# Patient Record
Sex: Female | Born: 1990 | Race: White | Hispanic: No | Marital: Single | Smoking: Never smoker
Health system: Southern US, Community
[De-identification: ages and names within clinical notes are randomized; demographics above are authoritative.]

## PROBLEM LIST (undated history)

## (undated) DIAGNOSIS — M5430 Sciatica, unspecified side: Secondary | ICD-10-CM

## (undated) HISTORY — PX: ABDOMINAL SURGERY: SHX537

## (undated) HISTORY — PX: CHOLECYSTECTOMY: SHX55

## (undated) MED ORDER — TRAMADOL 50 MG TAB
50 mg | ORAL_TABLET | Freq: Four times a day (QID) | ORAL | Status: DC | PRN
Start: ? — End: 2013-06-27

## (undated) MED ORDER — PROMETHAZINE 25 MG TAB
25 mg | ORAL_TABLET | Freq: Four times a day (QID) | ORAL | Status: DC | PRN
Start: ? — End: 2013-06-03

## (undated) MED ORDER — OXYCODONE-ACETAMINOPHEN 5 MG-325 MG TAB
5-325 mg | ORAL_TABLET | Freq: Four times a day (QID) | ORAL | Status: DC | PRN
Start: ? — End: 2013-09-28

## (undated) MED ORDER — CEPHALEXIN 500 MG CAP
500 mg | ORAL_CAPSULE | Freq: Three times a day (TID) | ORAL | Status: DC
Start: ? — End: 2013-06-03

## (undated) MED ORDER — HYDROCODONE-ACETAMINOPHEN 5 MG-325 MG TAB
5-325 mg | ORAL_TABLET | ORAL | Status: DC | PRN
Start: ? — End: 2013-11-26

## (undated) MED ORDER — ONDANSETRON 4 MG TAB, RAPID DISSOLVE
4 mg | ORAL_TABLET | Freq: Three times a day (TID) | ORAL | Status: DC | PRN
Start: ? — End: 2013-06-03

## (undated) MED ORDER — AZITHROMYCIN 250 MG TAB
250 mg | PACK | ORAL | Status: DC
Start: ? — End: 2013-06-08

## (undated) MED ORDER — ALBUTEROL SULFATE HFA 90 MCG/ACTUATION AEROSOL INHALER
90 mcg/actuation | RESPIRATORY_TRACT | Status: DC | PRN
Start: ? — End: 2013-06-08

## (undated) MED ORDER — ONDANSETRON 4 MG TAB, RAPID DISSOLVE
4 mg | ORAL_TABLET | Freq: Three times a day (TID) | ORAL | Status: DC | PRN
Start: ? — End: 2013-06-27

## (undated) MED ORDER — CODEINE-GUAIFENESIN 10 MG-100 MG/5 ML ORAL LIQUID
100-10 mg/5 mL | Freq: Three times a day (TID) | ORAL | Status: DC | PRN
Start: ? — End: 2013-06-08

## (undated) MED ORDER — CLINDAMYCIN 300 MG CAP
300 mg | ORAL_CAPSULE | Freq: Two times a day (BID) | ORAL | Status: DC
Start: ? — End: 2013-06-08

## (undated) MED ORDER — LEVOFLOXACIN 500 MG TAB
500 mg | ORAL_TABLET | Freq: Every day | ORAL | Status: AC
Start: ? — End: 2013-06-15

---
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
## 2008-01-26 ENCOUNTER — Emergency Department: Payer: Self-pay | Admitting: Emergency Medicine

## 2008-01-29 ENCOUNTER — Ambulatory Visit: Payer: Self-pay | Admitting: General Surgery

## 2008-03-02 ENCOUNTER — Emergency Department: Payer: Self-pay | Admitting: Emergency Medicine

## 2008-05-14 ENCOUNTER — Emergency Department: Payer: Self-pay | Admitting: Unknown Physician Specialty

## 2008-05-22 ENCOUNTER — Emergency Department: Payer: Self-pay | Admitting: Emergency Medicine

## 2008-07-15 ENCOUNTER — Observation Stay: Payer: Self-pay

## 2008-08-01 ENCOUNTER — Observation Stay: Payer: Self-pay

## 2008-08-18 ENCOUNTER — Observation Stay: Payer: Self-pay | Admitting: Obstetrics & Gynecology

## 2008-08-19 ENCOUNTER — Ambulatory Visit: Payer: Self-pay | Admitting: Obstetrics & Gynecology

## 2008-09-15 ENCOUNTER — Observation Stay: Payer: Self-pay

## 2008-09-28 ENCOUNTER — Observation Stay: Payer: Self-pay | Admitting: Unknown Physician Specialty

## 2008-10-08 ENCOUNTER — Inpatient Hospital Stay: Payer: Self-pay | Admitting: Obstetrics and Gynecology

## 2008-12-04 ENCOUNTER — Emergency Department: Payer: Self-pay | Admitting: Emergency Medicine

## 2009-04-29 ENCOUNTER — Emergency Department: Payer: Self-pay | Admitting: Internal Medicine

## 2009-05-02 ENCOUNTER — Ambulatory Visit: Payer: Self-pay | Admitting: Internal Medicine

## 2009-05-13 ENCOUNTER — Emergency Department: Payer: Self-pay | Admitting: Emergency Medicine

## 2010-06-06 ENCOUNTER — Emergency Department: Payer: Self-pay | Admitting: Emergency Medicine

## 2010-07-14 ENCOUNTER — Emergency Department: Payer: Self-pay | Admitting: Emergency Medicine

## 2010-08-17 ENCOUNTER — Emergency Department: Payer: Self-pay | Admitting: Emergency Medicine

## 2010-09-09 ENCOUNTER — Emergency Department: Payer: Self-pay | Admitting: Emergency Medicine

## 2010-09-17 ENCOUNTER — Emergency Department: Payer: Self-pay | Admitting: Emergency Medicine

## 2010-09-29 ENCOUNTER — Emergency Department: Payer: Self-pay | Admitting: Emergency Medicine

## 2010-10-10 ENCOUNTER — Emergency Department: Payer: Self-pay | Admitting: Emergency Medicine

## 2010-12-26 ENCOUNTER — Emergency Department: Payer: Self-pay | Admitting: Psychiatry

## 2011-03-20 ENCOUNTER — Emergency Department: Payer: Self-pay | Admitting: Emergency Medicine

## 2011-05-17 ENCOUNTER — Emergency Department: Payer: Self-pay | Admitting: Emergency Medicine

## 2011-05-20 ENCOUNTER — Emergency Department: Payer: Self-pay | Admitting: Emergency Medicine

## 2011-07-13 ENCOUNTER — Emergency Department: Payer: Self-pay | Admitting: Emergency Medicine

## 2011-08-19 ENCOUNTER — Emergency Department: Payer: Self-pay | Admitting: Unknown Physician Specialty

## 2011-10-13 ENCOUNTER — Emergency Department: Payer: Self-pay | Admitting: Emergency Medicine

## 2011-12-24 ENCOUNTER — Emergency Department: Payer: Self-pay | Admitting: Emergency Medicine

## 2012-02-11 ENCOUNTER — Emergency Department: Payer: Self-pay | Admitting: Emergency Medicine

## 2012-02-11 LAB — URINALYSIS, COMPLETE
Bilirubin,UR: NEGATIVE
Nitrite: NEGATIVE
Ph: 5 (ref 4.5–8.0)
RBC,UR: 4 /HPF (ref 0–5)
Specific Gravity: 1.026 (ref 1.003–1.030)
Squamous Epithelial: 3

## 2012-02-25 ENCOUNTER — Other Ambulatory Visit (HOSPITAL_COMMUNITY): Payer: Self-pay | Admitting: Neurosurgery

## 2012-02-25 DIAGNOSIS — M545 Low back pain, unspecified: Secondary | ICD-10-CM

## 2012-02-25 DIAGNOSIS — M5126 Other intervertebral disc displacement, lumbar region: Secondary | ICD-10-CM

## 2012-02-28 ENCOUNTER — Other Ambulatory Visit: Payer: Self-pay | Admitting: Radiology

## 2012-02-29 ENCOUNTER — Encounter (HOSPITAL_COMMUNITY): Payer: Self-pay | Admitting: Pharmacy Technician

## 2012-03-03 ENCOUNTER — Encounter (HOSPITAL_COMMUNITY): Payer: Self-pay

## 2012-03-03 ENCOUNTER — Ambulatory Visit (HOSPITAL_COMMUNITY)
Admission: RE | Admit: 2012-03-03 | Discharge: 2012-03-03 | Disposition: A | Discharge status: Home / Self Care | Payer: Self-pay | Source: Ambulatory Visit | Attending: Neurosurgery | Admitting: Neurosurgery

## 2012-03-03 DIAGNOSIS — M79609 Pain in unspecified limb: Secondary | ICD-10-CM | POA: Insufficient documentation

## 2012-03-03 DIAGNOSIS — M545 Low back pain, unspecified: Secondary | ICD-10-CM

## 2012-03-03 DIAGNOSIS — M5126 Other intervertebral disc displacement, lumbar region: Secondary | ICD-10-CM

## 2012-03-03 MED ORDER — ONDANSETRON HCL 4 MG/2ML IJ SOLN: 4.0000 mg | Freq: Four times a day (QID) | INTRAMUSCULAR | Status: DC | PRN

## 2012-03-03 MED ORDER — IOHEXOL 180 MG/ML  SOLN
20.0000 mL | Freq: Once | INTRAMUSCULAR | Status: AC | PRN
  Administered 2012-03-03: 13 mL via INTRATHECAL

## 2012-03-03 MED ORDER — OXYCODONE-ACETAMINOPHEN 5-325 MG PO TABS
2.0000 | ORAL_TABLET | Freq: Once | ORAL | Status: AC
Start: 2012-03-03 — End: 2012-03-03
  Administered 2012-03-03: 2 via ORAL

## 2012-03-03 MED ORDER — DIAZEPAM 5 MG PO TABS
10.0000 mg | ORAL_TABLET | Freq: Once | ORAL | Status: AC
  Administered 2012-03-03: 10 mg via ORAL

## 2012-03-03 MED ORDER — OXYCODONE-ACETAMINOPHEN 5-325 MG PO TABS
ORAL_TABLET | ORAL | Status: AC
  Filled 2012-03-03: qty 2

## 2012-03-03 MED ORDER — DIAZEPAM 5 MG PO TABS
ORAL_TABLET | ORAL | Status: AC
  Administered 2012-03-03: 10 mg via ORAL
  Filled 2012-03-03: qty 2

## 2012-03-03 NOTE — Procedures (Signed)
15 ml Omni 180 @ L2/3 via a right paramidline approach with a 22g needle.  No complication.  See radiology tab for full report.

## 2012-03-03 NOTE — Discharge Instructions (Signed)
HOLD ULTRAM & NUCYNTA FOR 24 HOURS.  MAY RESUME ALL OTHER HOME MEDS.     Myelography Care After These instructions give you information on caring for yourself after your procedure. Your doctor may also give you specific instructions. Call your doctor if you have any problems or questions after your procedure. HOME CARE  Lie down for 24 hours. Lie in any position with 1 pillow under your head.   For 24 hours, get up only to eat or use the bathroom. Take only 10 minutes to eat.   For 24 hours, drink enough fluids to keep your pee (urine) clear or pale yellow. No alcohol.   Take all medicine as told by your doctor.   Avoid heavy lifting and activity for 48 hours.   You may take the bandage off the day after your myelography.   Do not take a bath for 24 hours. Ask your doctor if it is okay to take a shower.  Finding out the results of your test Ask your doctor when your test results will be ready. Make sure you follow up and get the test results. GET HELP RIGHT AWAY IF:   Any of the places where the needles were put in:   Are puffy (swollen) or red.   Are sore or hot to the touch.   Are draining yellowish-white fluid (pus).   Are bleeding after 10 minutes of pressing down on the site. Have someone press on any place that is bleeding until seen by a doctor.   You have a lasting headache that is not helped by medicine.   You have a bad headache with a stiff neck or fever.   You have trouble breathing.   You feel sick to your stomach (nauseous) or throw up (vomit).   You have pain or cramping in your belly (abdomen).   You have a fever.  If you go to the emergency room, tell the doctor you had a myelogram. Take this paper with you to show the doctor. MAKE SURE YOU:  Understand these instructions.   Will watch your condition.   Will get help right away if you are not doing well or get worse.  Document Released: 05/29/2008 Document Revised: 08/09/2011 Document  Reviewed: 05/29/2008 [facility] Patient Information 2012 [place], [place].

## 2012-04-16 ENCOUNTER — Emergency Department: Payer: Self-pay | Admitting: Emergency Medicine

## 2012-04-16 LAB — URINALYSIS, COMPLETE
Blood: NEGATIVE
Nitrite: POSITIVE
Ph: 5 (ref 4.5–8.0)
Protein: NEGATIVE
Specific Gravity: 1.023 (ref 1.003–1.030)
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
## 2013-05-05 NOTE — ED Provider Notes (Signed)
HPI Comments: Toni Martinez is a 22 y.o. female who presents ambulatory to the ED with c/o nasal congestion and sore throat for 4 days.  She has been using OTC meds.  No fever or chills, some cough.  + sick contacts.  Throat pain is 8/10.  LKMP now.     PCP: None  PMHx significant for: denies  PSHx significant for: denies  Social Hx:  smoking (denies)                     alcohol (denies)                       There are no other complaints, changes, or physical findings at this time.  Charlie Pitter, MD           History reviewed. No pertinent past medical history.     Past Surgical History   Procedure Laterality Date   ??? Hx cholecystectomy     ??? Hx cesarean section           History reviewed. No pertinent family history.     History     Social History   ??? Marital Status: SINGLE     Spouse Name: N/A     Number of Children: N/A   ??? Years of Education: N/A     Occupational History   ??? Not on file.     Social History Main Topics   ??? Smoking status: Never Smoker    ??? Smokeless tobacco: Not on file   ??? Alcohol Use: No   ??? Drug Use: Not on file   ??? Sexually Active: Not on file     Other Topics Concern   ??? Not on file     Social History Narrative   ??? No narrative on file                  ALLERGIES: Pcn      Review of Systems   Constitutional: Negative for fever and chills.   HENT: Positive for congestion, sore throat, rhinorrhea, sneezing and postnasal drip. Negative for trouble swallowing, neck pain and neck stiffness.    Respiratory: Positive for cough.    Cardiovascular: Negative for chest pain.   Gastrointestinal: Negative for nausea, vomiting and abdominal pain.   Genitourinary: Negative for dysuria.   Neurological: Negative for weakness.   Hematological: Positive for adenopathy.       Filed Vitals:    05/05/13 0727   BP: 117/82   Pulse: 88   Temp: 98.9 ??F (37.2 ??C)   Resp: 16   Height: 5\' 5"  (1.651 m)   Weight: 92.9 kg (204 lb 12.9 oz)   SpO2: 98%            Physical Exam   Nursing note and vitals reviewed.   Constitutional: She is oriented to person, place, and time. She appears well-developed and well-nourished. No distress.   HENT:   Head: Normocephalic and atraumatic.   Right Ear: External ear normal.   Left Ear: External ear normal.   Moist mucous membranes with minimal pharyngeal erythema   Eyes: EOM are normal. Pupils are equal, round, and reactive to light.   Neck: Normal range of motion. Neck supple. No tracheal deviation present.   Shoddy anterior cervical adenopathy   Cardiovascular: Normal rate, regular rhythm, normal heart sounds and intact distal pulses.    No murmur heard.  Pulmonary/Chest: Effort normal. No stridor. No respiratory distress. She  has wheezes.   Musculoskeletal: Normal range of motion. She exhibits no edema.   Lymphadenopathy:     She has cervical adenopathy.   Neurological: She is alert and oriented to person, place, and time.   Skin: Skin is warm and dry.        MDM     Differential Diagnosis; Clinical Impression; Plan:     Viral URI vs allergic rhinitis - will treat symptomatically.       Procedures    1. Acute upper respiratory infection      7:47 AM  Jodie Echevaria  results have been reviewed with her.  She has been counseled regarding her diagnosis.  She verbally conveys understanding and agreement of the signs, symptoms, diagnosis, treatment and prognosis.  PCP list was given.  She also agrees with the care-plan and conveys that all of her questions have been answered.  I have also put together some discharge instructions for her that include: 1) educational information regarding their diagnosis, 2) how to care for their diagnosis at home, as well a 3) list of reasons why they would want to return to the ED prior to their follow-up appointment, should their condition change.

## 2013-05-05 NOTE — ED Notes (Signed)
Assumed care of patient. Pt resting in position of comfort. Call bell within reach. Pt here for productive cough with green sputum, sore throat, nasal congestion and right ear pain for several days. Works in [employer] and states + sick contacts. Has been taking tylenol and motrin for relief.

## 2013-05-05 NOTE — ED Notes (Signed)
Discharge instructions reviewed with pt by Dr. Eulah Pont. pt able to return/verbalize discharge instructions. Copy of discharge instructions given. Patient condition stable, respiratory status within normal limits, neuro status intact. ambulatory out of er.

## 2013-05-27 NOTE — ED Provider Notes (Signed)
HPI Comments: Toni Martinez is a 22 y.o. female who presents ambulatory to ED with cc of productive cough x 1 month. Pt reports productive cough with mucus x 1 month, noting she was seen at [facility] ED 2 weeks ago for similar sxs and was dx'd with URI. Pt notes cough is worse with deep inspiration, as well as associated post-tussive emesis, wheezing, dizziness, lightheadedness, and chest tightness. Pt also notes hx of UTI and reports BL flank pain, nausea, suprapubic pain, dysuria, and fever of 101F. Pt notes taking tylenol around 1800 tonight.      PCP: None   PMHx significant for: pt denies  PSHx significant for: Cholecystectomy, c-section  Social Hx: -tobacco, -EtOH, -illicit drug use    There are no other complaints, changes or physical findings at this time.   Written by Adele Dan, ED Scribe, as dictated by Abbigayle Toole. Laurier Nancy, MD.      The history is provided by the patient.        No past medical history on file.     Past Surgical History   Procedure Laterality Date   ??? Hx cholecystectomy     ??? Hx cesarean section           No family history on file.     History     Social History   ??? Marital Status: SINGLE     Spouse Name: N/A     Number of Children: N/A   ??? Years of Education: N/A     Occupational History   ??? Not on file.     Social History Main Topics   ??? Smoking status: Never Smoker    ??? Smokeless tobacco: Not on file   ??? Alcohol Use: No   ??? Drug Use: Not on file   ??? Sexually Active: Not on file     Other Topics Concern   ??? Not on file     Social History Narrative   ??? No narrative on file                  ALLERGIES: Pcn      Review of Systems   Constitutional: Positive for fever. Negative for chills.   HENT: Negative.  Negative for congestion, sore throat, rhinorrhea, sneezing and neck stiffness.    Eyes: Negative.  Negative for discharge, redness and visual disturbance.   Respiratory: Positive for cough (productive), chest tightness and wheezing. Negative for shortness of breath.    Cardiovascular: Negative.   Negative for chest pain, palpitations and leg swelling.   Gastrointestinal: Positive for nausea, vomiting (post-tussive) and abdominal pain (suprapubic).   Endocrine: Negative.  Negative for polydipsia and polyuria.   Genitourinary: Positive for dysuria and flank pain (BL). Negative for frequency and difficulty urinating.   Musculoskeletal: Negative.  Negative for myalgias, back pain and arthralgias.   Skin: Negative.  Negative for rash and wound.   Allergic/Immunologic: Negative.    Neurological: Positive for dizziness and light-headedness. Negative for syncope, weakness and headaches.   Hematological: Negative.  Negative for adenopathy. Does not bruise/bleed easily.   Psychiatric/Behavioral: Negative.  Negative for confusion. The patient is not nervous/anxious.    All other systems reviewed and are negative.        Filed Vitals:    05/27/13 2023 05/27/13 2355   BP: 182/102 115/79   Pulse: 130 88   Temp: 98.3 ??F (36.8 ??C)    Resp: 20 18   Height: 5\' 4"  (1.626 Chavy [employer]vera)  Weight: 93 kg (205 lb 0.4 oz)    SpO2: 99% 98%            Physical Exam   Nursing note and vitals reviewed.  Constitutional: She is oriented to person, place, and time. She appears well-developed and well-nourished. She appears distressed.   Vomiting with cough   HENT:   Head: Normocephalic and atraumatic.   Eyes: Conjunctivae and EOM are normal. Pupils are equal, round, and reactive to light.   Neck: Normal range of motion. Neck supple. No JVD present.   Cardiovascular: Normal rate, regular rhythm, normal heart sounds and intact distal pulses.    No murmur heard.  Pulmonary/Chest: Breath sounds normal. No respiratory distress. She has no wheezes. She has no rales. She exhibits no tenderness.   Prolonged exp phase   Abdominal: Soft. Bowel sounds are normal. She exhibits no distension and no mass. There is no tenderness. There is no rebound and no guarding.   Musculoskeletal: Normal range of motion. She exhibits no edema and no tenderness.   Neurological:  She is alert and oriented to person, place, and time. She has normal strength. No cranial nerve deficit or sensory deficit. She exhibits normal muscle tone.   Skin: Skin is warm and dry. No rash noted.   Psychiatric: She has a normal mood and affect. Her behavior is normal.        MDM     Differential Diagnosis; Clinical Impression; Plan:     DDx: Bronchitis, Pneumonia, UTI, MSK pain    UTI/pregnant, cough with recent URI, better with meds, will refer to PCP for f/u  Amount and/or Complexity of Data Reviewed:   Clinical lab tests:  Ordered and reviewed  Tests in the radiology section of CPT??:  Ordered and reviewed   Obtain history from someone other than the patient:  No   Review and summarize past medical records:  Yes   Discuss the patient with another provider:  No   Independant visualization of image, tracing, or specimen:  Yes  Progress:   Patient progress:  Stable and improved      Procedures    11:08 PM  Pt has been re-evaluated and results discussed. Pt notified of pregnancy, will order lab work and medicate.   Written by Adele Dan, ED Scribe, as dictated by Dulac Dowis. Laurier Nancy, MD.     12:21 AM    I have reviewed all pertinent and currently available diagnostic test results for this visit including, but not limited to, labs, xrays, and EKGs. I have reviewed all pertinent and currently available medical records. My plan of care and further evaluation and/or disposition is based on these results, as well as the initial, and subsequent, history and physical exam, as well as any additional complaints during the visit.    Toney Rakes, MD     12:34 AM  Pt has been re-evaluated and states she is hungry. Able to tolerate crackers and ginger ale PO.   Written by Adele Dan, ED Scribe, as dictated by Siboney Requejo. Laurier Nancy, MD.     LABORATORY TESTS:  Recent Results (from the past 12 hour(s))   HCG URINE, QL    Collection Time     05/27/13  9:46 PM       Result Value Range    HCG urine, Ql. POSITIVE (*) NEG      URINALYSIS W/ REFLEX CULTURE    Collection Time     05/27/13  9:46 PM  Result Value Range    Color YELLOW/STRAW      Appearance TURBID (*) CLEAR      Specific gravity 1.030  1.003 - 1.030      pH (UA) 5.5  5.0 - 8.0      Protein 30 (*) NEG mg/dL    Glucose NEGATIVE   NEG mg/dL    Ketone NEGATIVE   NEG mg/dL    Bilirubin SMALL (*) NEG      Blood NEGATIVE   NEG      Urobilinogen 1.0  0.2 - 1.0 EU/dL    Nitrites NEGATIVE   NEG      Leukocyte Esterase MODERATE (*) NEG      WBC 10-20  0 - 4 /hpf    RBC 20-50  0 - 5 /hpf    Epithelial cells MANY (*) FEW /lpf    Bacteria 1+ (*) NEG /hpf    UA:UC IF INDICATED URINE CULTURE ORDERED (*) CNI      CA Oxalate Crystals 1+ (*) NEG   CBC WITH AUTOMATED DIFF    Collection Time     05/27/13 10:56 PM       Result Value Range    WBC 9.4  3.6 - 11.0 K/uL    RBC 4.52  3.80 - 5.20 Orrie Lascano/uL    HGB 11.4 (*) 11.5 - 16.0 g/dL    HCT 16.1  09.6 - 04.5 %    MCV 78.8 (*) 80.0 - 99.0 FL    MCH 25.2 (*) 26.0 - 34.0 PG    MCHC 32.0  30.0 - 36.5 g/dL    RDW 40.9 (*) 81.1 - 14.5 %    PLATELET 226  150 - 400 K/uL    NEUTROPHILS 61  32 - 75 %    LYMPHOCYTES 32  12 - 49 %    MONOCYTES 5  5 - 13 %    EOSINOPHILS 2  0 - 7 %    BASOPHILS 0  0 - 1 %    ABS. NEUTROPHILS 5.7  1.8 - 8.0 K/UL    ABS. LYMPHOCYTES 3.0  0.8 - 3.5 K/UL    ABS. MONOCYTES 0.5  0.0 - 1.0 K/UL    ABS. EOSINOPHILS 0.1  0.0 - 0.4 K/UL    ABS. BASOPHILS 0.0  0.0 - 0.1 K/UL   METABOLIC PANEL, COMPREHENSIVE    Collection Time     05/27/13 10:56 PM       Result Value Range    Sodium 140  136 - 145 mmol/L    Potassium 3.2 (*) 3.5 - 5.1 mmol/L    Chloride 106  97 - 108 mmol/L    CO2 24  21 - 32 mmol/L    Anion gap 10  5 - 15 mmol/L    Glucose 109 (*) 65 - 100 mg/dL    BUN 14  6 - 20 MG/DL    Creatinine 9.14  7.82 - 1.15 MG/DL    BUN/Creatinine ratio 17  12 - 20      GFR est AA >60  >60 ml/min/1.40m2    GFR est non-AA >60  >60 ml/min/1.41m2    Calcium 9.2  8.5 - 10.1 MG/DL    Bilirubin, total 0.3  0.2 - 1.0 MG/DL    ALT 17  12 - 78 U/L    AST  13 (*) 15 - 37 U/L    Alk. phosphatase 68  45 - 117 U/L    Protein, total 7.7  6.4 -  8.2 g/dL    Albumin 4.1  3.5 - 5.0 g/dL    Globulin 3.6  2.0 - 4.0 g/dL    A-G Ratio 1.1  1.1 - 2.2     LIPASE    Collection Time     05/27/13 10:56 PM       Result Value Range    Lipase 123  73 - 393 U/L       IMAGING RESULTS:     XR CHEST PA LAT (Final result)  Result time: 05/27/13 21:07:11      Final result by Rad Results In Edi (05/27/13 21:07:11)      Narrative:    **Final Report**      ICD Codes / Adm.Diagnosis: 28 12 / Cough Back Pain  Examination: CR CHEST PA AND LATERAL - [id number] - May 27 2013 8:55PM  Accession No: [id number]  Reason: Chest Pain      REPORT:  INDICATION: Chest Pain.    EXAM: 2 VIEW CHEST RADIOGRAPH.    COMPARISON: None.    FINDINGS: Frontal and lateral views of the chest show clear lungs. . The   heart, mediastinum and pulmonary vasculature are stable. The bony thorax is   unremarkable for age. ..      IMPRESSION:  No acute cardiopulmonary disease radiographically.. .          Signing/Reading Doctor: Creed Copper [phone])   Approved: Creed Copper [phone]) May 27 2013 9:05PM            MEDICATIONS GIVEN:  Medications   sodium chloride (NS) flush 5-10 mL (not administered)   sodium chloride (NS) flush 5-10 mL (not administered)   diphenhydrAMINE (BENADRYL) 50 mg/mL injection (not administered)   albuterol-ipratropium (DUO-NEB) 2.5 MG-0.5 MG/3 ML (3 mL Nebulization Given 05/27/13 2111)   ondansetron (ZOFRAN ODT) tablet 4 mg (4 mg Oral Given 05/27/13 2111)   hydrocodone-acetaminophen (HYCET) 0.5-21.7 mg/mL oral solution 5 mg (5 mg Oral Given 05/27/13 2111)   sodium chloride 0.9 % bolus infusion 1,000 mL (1,000 mL IntraVENous New Bag 05/27/13 2346)   ondansetron (ZOFRAN) injection 4 mg (4 mg IntraVENous Given 05/27/13 2346)   diphenhydrAMINE (BENADRYL) injection 25 mg (25 mg IntraVENous Given 05/27/13 2353)       IMPRESSION:  1. Bronchitis    2. Vomiting of pregnancy    3. Pregnancy, incidental    4. UTI  (urinary tract infection)        PLAN:  1. Phenergan, Zofran, Keflex, Albuterol inhaler, Robitussin  2. F/U with OB/GYN  Return to ED if worse     12:22 AM   Pt has been re-examined and is ready to be discharged. The diagnostic results have been reviewed and discussed with the pt. Care plan has been outlined and pt understands all sx, dx, tx, and rx. There are no new complaints, changes, or physical findings at this time. All questions have been addressed. All medications have been reviewed with the pt; pt will d/c home with Phenergan, Zofran, Keflex, Albuterol inhaler, and Robitussin. Pt has been instructed to and agrees to f/u with OB/GYN as well as return to the ED upon further deterioration. Toni Martinez is ready for discharge.   Written by Adele Dan, ED Scribe, as dictated by Jef Futch. Laurier Nancy, MD.

## 2013-05-27 NOTE — ED Notes (Signed)
Patient c/o itching and burning to IV site after zofran administration.  MD aware and verbal orders for 25 mg of benadryl IV received.

## 2013-05-27 NOTE — ED Notes (Signed)
Patient arrives to ED with complaints of cough x approx. 1 month, diagnosed with URI on 9/2, reports only improvement from symptoms was congestion, no improvement in cough. Patient also reports bilateral lower back pain, pelvic pain, and urinary pain x a few days. Patient denies blood in urine. Patient reports some nausea and cough-induced vomiting. Patient is alert and oriented x 4.

## 2013-05-28 LAB — METABOLIC PANEL, COMPREHENSIVE
A-G Ratio: 1.1 (ref 1.1–2.2)
ALT (SGPT): 17 U/L (ref 12–78)
AST (SGOT): 13 U/L — ABNORMAL LOW (ref 15–37)
Albumin: 4.1 g/dL (ref 3.5–5.0)
Alk. phosphatase: 68 U/L (ref 45–117)
Anion gap: 10 mmol/L (ref 5–15)
BUN/Creatinine ratio: 17 (ref 12–20)
BUN: 14 MG/DL (ref 6–20)
Bilirubin, total: 0.3 MG/DL (ref 0.2–1.0)
CO2: 24 mmol/L (ref 21–32)
Calcium: 9.2 MG/DL (ref 8.5–10.1)
Chloride: 106 mmol/L (ref 97–108)
Creatinine: 0.81 MG/DL (ref 0.45–1.15)
GFR est AA: >60 mL/min/{1.73_m2} (ref >60)
GFR est non-AA: >60 mL/min/{1.73_m2} (ref >60)
Globulin: 3.6 g/dL (ref 2.0–4.0)
Glucose: 109 mg/dL — ABNORMAL HIGH (ref 65–100)
Potassium: 3.2 mmol/L — ABNORMAL LOW (ref 3.5–5.1)
Protein, total: 7.7 g/dL (ref 6.4–8.2)
Sodium: 140 mmol/L (ref 136–145)

## 2013-05-28 LAB — CBC WITH AUTOMATED DIFF
ABS. BASOPHILS: 0 10*3/uL (ref 0.0–0.1)
ABS. EOSINOPHILS: 0.1 10*3/uL (ref 0.0–0.4)
ABS. LYMPHOCYTES: 3 10*3/uL (ref 0.8–3.5)
ABS. MONOCYTES: 0.5 10*3/uL (ref 0.0–1.0)
ABS. NEUTROPHILS: 5.7 10*3/uL (ref 1.8–8.0)
BASOPHILS: 0 % (ref 0–1)
EOSINOPHILS: 2 % (ref 0–7)
HCT: 35.6 % (ref 35.0–47.0)
HGB: 11.4 g/dL — ABNORMAL LOW (ref 11.5–16.0)
LYMPHOCYTES: 32 % (ref 12–49)
MCH: 25.2 PG — ABNORMAL LOW (ref 26.0–34.0)
MCHC: 32 g/dL (ref 30.0–36.5)
MCV: 78.8 FL — ABNORMAL LOW (ref 80.0–99.0)
MONOCYTES: 5 % (ref 5–13)
NEUTROPHILS: 61 % (ref 32–75)
PLATELET: 226 10*3/uL (ref 150–400)
RBC: 4.52 M/uL (ref 3.80–5.20)
RDW: 15.2 % — ABNORMAL HIGH (ref 11.5–14.5)
WBC: 9.4 10*3/uL (ref 3.6–11.0)

## 2013-05-28 LAB — URINALYSIS W/ REFLEX CULTURE
Blood: NEGATIVE
Glucose: NEGATIVE mg/dL
Ketone: NEGATIVE mg/dL
Nitrites: NEGATIVE
Protein: 30 mg/dL — AB
Specific gravity: 1.03 (ref 1.003–1.030)
Urobilinogen: 1 EU/dL (ref 0.2–1.0)
pH (UA): 5.5 (ref 5.0–8.0)

## 2013-05-28 LAB — HCG URINE, QL: HCG urine, QL: POSITIVE — AB

## 2013-05-28 LAB — LIPASE: Lipase: 123 U/L (ref 73–393)

## 2013-05-28 MED ORDER — HYDROCODONE-ACETAMINOPHEN 7.5 MG-325 MG/15 ML ORAL SOLN
ORAL | Status: AC
Start: 2013-05-28 — End: 2013-05-27
  Administered 2013-05-28: 01:00:00 via ORAL

## 2013-05-28 MED ADMIN — diphenhydrAMINE (BENADRYL) injection 25 mg: INTRAVENOUS | @ 04:00:00 | NDC 63323066401

## 2013-05-28 MED ADMIN — sodium chloride 0.9 % bolus infusion 1,000 mL: INTRAVENOUS | @ 04:00:00 | NDC 00409798309

## 2013-05-28 MED ADMIN — albuterol-ipratropium (DUO-NEB) 2.5 MG-0.5 MG/3 ML: RESPIRATORY_TRACT | @ 01:00:00 | NDC 00487020101

## 2013-05-28 MED ADMIN — ondansetron (ZOFRAN ODT) tablet 4 mg: ORAL | @ 01:00:00 | NDC 68462015740

## 2013-05-28 MED ADMIN — ondansetron (ZOFRAN) injection 4 mg: INTRAVENOUS | @ 04:00:00 | NDC 00409475503

## 2013-05-28 NOTE — ED Notes (Signed)
Dr. Garry Heater reviewed discharge instructions with the patient.  The patient verbalized understanding.  Patient ambulatory out of ED with discharge paperwork in hand.

## 2013-05-28 NOTE — ED Notes (Signed)
Patient given crackers and a ginger ale.  Patient tolerated PO challenge well.

## 2013-05-29 NOTE — Progress Notes (Signed)
Quick Note:    D/c home on keflex. Final results and sensitivities pending.  ______

## 2013-05-30 LAB — CULTURE, URINE
Colonies Counted: >100000
Colony Count: >100000

## 2013-05-30 NOTE — Progress Notes (Signed)
Quick Note:    I called a script in for Macrobid to [facility] [phone]).  ______

## 2013-05-30 NOTE — Progress Notes (Signed)
Quick Note:    I left a message.  ______

## 2013-06-03 LAB — BETA HCG, QT
Beta HCG, QT: 55 m[IU]/mL — ABNORMAL HIGH (ref 0–6)
hCG Quant: 55 m[IU]/mL — ABNORMAL HIGH (ref 0–6)

## 2013-06-03 LAB — URINALYSIS W/ REFLEX CULTURE
Bacteria: NEGATIVE /hpf
Bilirubin: NEGATIVE
Blood: NEGATIVE
Glucose: NEGATIVE mg/dL
Ketone: NEGATIVE mg/dL
Leukocyte Esterase: NEGATIVE
Nitrites: NEGATIVE
Specific gravity: 1.026 (ref 1.003–1.030)
Urobilinogen: 1 EU/dL (ref 0.2–1.0)
pH (UA): 6 (ref 5.0–8.0)

## 2013-06-03 LAB — CBC WITH AUTOMATED DIFF
ABS. BASOPHILS: 0 10*3/uL (ref 0.0–0.1)
ABS. EOSINOPHILS: 0.1 10*3/uL (ref 0.0–0.4)
ABS. LYMPHOCYTES: 1.9 10*3/uL (ref 0.8–3.5)
ABS. MONOCYTES: 0.5 10*3/uL (ref 0.0–1.0)
ABS. NEUTROPHILS: 5.3 10*3/uL (ref 1.8–8.0)
BASOPHILS: 0 % (ref 0–1)
EOSINOPHILS: 1 % (ref 0–7)
HCT: 34.5 % — ABNORMAL LOW (ref 35.0–47.0)
HGB: 11.1 g/dL — ABNORMAL LOW (ref 11.5–16.0)
LYMPHOCYTES: 25 % (ref 12–49)
MCH: 25.3 PG — ABNORMAL LOW (ref 26.0–34.0)
MCHC: 32.2 g/dL (ref 30.0–36.5)
MCV: 78.8 FL — ABNORMAL LOW (ref 80.0–99.0)
MONOCYTES: 6 % (ref 5–13)
NEUTROPHILS: 68 % (ref 32–75)
PLATELET: 239 10*3/uL (ref 150–400)
RBC: 4.38 M/uL (ref 3.80–5.20)
RDW: 15.2 % — ABNORMAL HIGH (ref 11.5–14.5)
WBC: 7.7 10*3/uL (ref 3.6–11.0)

## 2013-06-03 LAB — METABOLIC PANEL, COMPREHENSIVE
A-G Ratio: 1 — ABNORMAL LOW (ref 1.1–2.2)
ALT (SGPT): 23 U/L (ref 12–78)
AST (SGOT): 11 U/L — ABNORMAL LOW (ref 15–37)
Albumin: 3.8 g/dL (ref 3.5–5.0)
Alk. phosphatase: 72 U/L (ref 45–117)
Anion gap: 8 mmol/L (ref 5–15)
BUN/Creatinine ratio: 16 (ref 12–20)
BUN: 10 MG/DL (ref 6–20)
Bilirubin, total: 0.9 MG/DL (ref 0.2–1.0)
CO2: 24 mmol/L (ref 21–32)
Calcium: 9.2 MG/DL (ref 8.5–10.1)
Chloride: 107 mmol/L (ref 97–108)
Creatinine: 0.62 MG/DL (ref 0.45–1.15)
GFR est AA: >60 mL/min/{1.73_m2} (ref >60)
GFR est non-AA: >60 mL/min/{1.73_m2} (ref >60)
Globulin: 3.7 g/dL (ref 2.0–4.0)
Glucose: 102 mg/dL — ABNORMAL HIGH (ref 65–100)
Potassium: 3.7 mmol/L (ref 3.5–5.1)
Protein, total: 7.5 g/dL (ref 6.4–8.2)
Sodium: 139 mmol/L (ref 136–145)

## 2013-06-03 LAB — WET PREP: Wet prep: NONE SEEN

## 2013-06-03 LAB — HCG URINE, QL. - POC: Pregnancy test,urine (POC): POSITIVE — AB

## 2013-06-03 LAB — BORDETELLA PERTUSSIS - PCR: Bordetella pertussis - PCR: NOT DETECTED

## 2013-06-03 MED ORDER — IPRATROPIUM-ALBUTEROL 2.5 MG-0.5 MG/3 ML NEB SOLUTION
2.5-0.53 mg-0.5 mg/3 ml | RESPIRATORY_TRACT | Status: AC
Start: 2013-06-03 — End: 2013-06-03
  Administered 2013-06-03: 11:00:00 via RESPIRATORY_TRACT

## 2013-06-03 NOTE — ED Provider Notes (Signed)
HPI Comments: 22 y.o. female with PMH significant for bronchitis presents to the ED from home via private vehicle with cc of cough. Pt reports a constant cough  for 1 month. Pt reports accompanying vomiting (post tussive 1 time per day) with difficulty keeping intake down and states she has had lower abdominal pain for 1 week. Pt complains of chest pain associated with coughing currently rated at 6/10 but resolves after coughing immediately. Pt reports a fluctuating temperature for 1 month with a measured high of 101.9 F occuring last night. Pt reports Tylenol use with control of temperature. Pt states she was evaluated at [facility] last week for symptoms and prescribed cough syrup with codeine, nausea medication, and an inhaler. Pt states the inhaler is not offering relief. Pt reports previous inhaler use with hx of bronchitis which she states she frequently has with symptoms identical to current symptoms.     Pt states she is currently pregnant with  LMP occuring 05/2013. Pt is unsure how far along she is and reports 2 previous pregnancies with hx of preeclampsia. Pt denies pleuritic pain, leg pain, leg swelling, hx of tobacco use, steroid use, dysuria, vaginal bleeding, vaginal discharge.     There are no other complaints at this time.    Surgical Hx is significant for: Cholecystectomy, C-section.     PCP: None  Social hx: reports that she has never smoked. She does not have any smokeless tobacco history on file. She reports that she does not drink alcohol.        The history is provided by the patient.        History reviewed. No pertinent past medical history.     Past Surgical History   Procedure Laterality Date   ??? Hx cholecystectomy     ??? Hx cesarean section           History reviewed. No pertinent family history.     History     Social History   ??? Marital Status: SINGLE     Spouse Name: N/A     Number of Children: N/A   ??? Years of Education: N/A     Occupational History   ??? Not on file.     Social History Main  Topics   ??? Smoking status: Never Smoker    ??? Smokeless tobacco: Not on file   ??? Alcohol Use: No   ??? Drug Use: Not on file   ??? Sexually Active: Not on file     Other Topics Concern   ??? Not on file     Social History Narrative   ??? No narrative on file                  ALLERGIES: Pcn      Review of Systems   Constitutional: Positive for fever.   Respiratory: Positive for cough.         Difficulty breathing.   Cardiovascular: Positive for chest pain.   Gastrointestinal: Positive for vomiting and abdominal pain.   Genitourinary: Negative for dysuria, vaginal bleeding and vaginal discharge.   All other systems reviewed and are negative.    Note written by Kenney Houseman, Scribe, as dictated by Donne Anon, MD 6:25 AM       Filed Vitals:    06/03/13 0610   BP: 145/89   Temp: 98.8 ??F (37.1 ??C)   Resp: 18   Height: 5\' 4"  (1.626 m)   Weight: 90.719 kg (200 lb)   SpO2:  97%            Physical Exam     Nursing note and vitals reviewed.  Constitutional: appears well-developed and well-nourished. No distress.   HENT:   Head: Normocephalic and atraumatic. Sclera anicteric  Nose: No rhinorrhea  Mouth/Throat: Oropharynx is clear and moist. Pharynx normal  Eyes: Conjunctivae are normal. Pupils are equal, round, and reactive to light. Right eye exhibits no discharge. Left eye exhibits no discharge. No scleral icterus.   Neck: Painless normal range of motion. Supple  Cardiovascular: Normal rate, regular rhythm, normal heart sounds and intact distal pulses.  Exam reveals no gallop and no friction rub.  No murmur heard. (HR 90s)  Pulmonary/Chest: Effort normal and breath sounds normal. No respiratory distress. no wheezes. no rales. Coughing throughout.  Abdominal: Soft. Bowel sounds are normal. Exhibits no distension and no mass. No tenderness. No guarding.   Musculoskeletal: Normal range of motion. no tenderness. No edema  GU: Pelvic exam: Scant thin white discharge in the vaginal vault, mild right adnexal tenderness, no CMT, no  lesions.   Lymphadenopathy:   No cervical adenopathy.   Neurological:  Alert and oriented to person, place, and time. Coordination normal. CN 2-12 intact.  Moving all extremities.    Skin: Skin is warm and dry. No rash noted. No pallor.     Note written by Kenney Houseman, Scribe, as dictated by Donne Anon, MD 7:23 AM     MDM     Differential Diagnosis; Clinical Impression; Plan:     Cough for 1 month with fever likely infectious. Given duration consider pna although no ascult findings of this. No wheezing detected making bronchospasm less likely. CP with coughing only making ptx or ACS unlikely. With duration of symptoms also consider PE. Pt also with lower abdominal pain and +pregnancy but no vaginal bleeding. No prior imaging in pregnancy to rule out ectopic. Will obtain CXR, pertussis, UA, hcg, CBC, CMP, and TVUS.      Procedures    7:53 AM   Patient feeling better after nebulizer treatment. Discussed with patient possibility of PE on differential, she feels SOB is minimal and less of a concern to her compared to cough. Patient refused diagnostic imaging for PE, discussed risks including permanent disability and death. Prefers to treat for bronchitis and agrees to return if symptoms are not improving for further evaluation of PE.   Note written by Kenney Houseman, Scribe, as dictated by Donne Anon, MD 7:53 AM    8:01 AM   Patient states her normal HR is in the 110's.  Note written by Kenney Houseman, Scribe, as dictated by Donne Anon, MD 8:02 AM     CONSULT NOTE:8:47 AM    Donne Anon, MD spoke with Dr. Margaretha Sheffield, Consult for OBGYN. Discussed available diagnostic tests and clinical findings. She is in agreement with care plans as outlined. She will see patient tomorrow.  Note written by Kenney Houseman, Scribe, as dictated by Donne Anon, MD 8:48 AM

## 2013-06-03 NOTE — ED Notes (Signed)
TRIAGE NOTE:  Pt reports URI and bronchitis over the last month.  Has not really improved after inhaler and cough medicine.  Pt is constantly coughing in triage, non-productive, audible wheezing.

## 2013-06-03 NOTE — ED Notes (Signed)
Patient has received discharge instructions from ER MD, verbalizes understanding.  Ambulatory upon discharge.

## 2013-06-03 NOTE — ED Notes (Signed)
Pt states she recently found out she was pregnant and has lower abdominal pain.

## 2013-06-05 LAB — CHLAMYDIA/GC PCR
Chlamydia amplified: NEGATIVE
N. gonorrhea, amplified: NEGATIVE

## 2013-06-05 NOTE — ED Notes (Cosign Needed)
Pt called regarding her culture results; she was updated on available results. Some cultures appear to still be pending. Yetta Numbers, NP

## 2013-06-08 LAB — URINALYSIS W/ REFLEX CULTURE
Glucose: NEGATIVE mg/dL
Nitrites: NEGATIVE
Protein: 100 mg/dL — AB
RBC: >100 /hpf — ABNORMAL HIGH (ref 0–5)
Specific gravity: 1.025 (ref 1.003–1.030)
Urobilinogen: 2 EU/dL — ABNORMAL HIGH (ref 0.2–1.0)
pH (UA): 7 (ref 5.0–8.0)

## 2013-06-08 LAB — TYPE & SCREEN
ABO/Rh(D): A POS
Antibody screen: NEGATIVE

## 2013-06-08 LAB — CBC WITH AUTOMATED DIFF
ABS. BASOPHILS: 0 10*3/uL (ref 0.0–0.1)
ABS. EOSINOPHILS: 0.1 10*3/uL (ref 0.0–0.4)
ABS. LYMPHOCYTES: 2.3 10*3/uL (ref 0.8–3.5)
ABS. MONOCYTES: 0.5 10*3/uL (ref 0.0–1.0)
ABS. NEUTROPHILS: 3.5 10*3/uL (ref 1.8–8.0)
BASOPHILS: 0 % (ref 0–1)
EOSINOPHILS: 2 % (ref 0–7)
HCT: 33.3 % — ABNORMAL LOW (ref 35.0–47.0)
HGB: 10.5 g/dL — ABNORMAL LOW (ref 11.5–16.0)
LYMPHOCYTES: 36 % (ref 12–49)
MCH: 25.2 PG — ABNORMAL LOW (ref 26.0–34.0)
MCHC: 31.5 g/dL (ref 30.0–36.5)
MCV: 79.9 FL — ABNORMAL LOW (ref 80.0–99.0)
MONOCYTES: 8 % (ref 5–13)
NEUTROPHILS: 54 % (ref 32–75)
PLATELET: 230 10*3/uL (ref 150–400)
RBC: 4.17 M/uL (ref 3.80–5.20)
RDW: 15.1 % — ABNORMAL HIGH (ref 11.5–14.5)
WBC: 6.4 10*3/uL (ref 3.6–11.0)

## 2013-06-08 LAB — METABOLIC PANEL, COMPREHENSIVE
A-G Ratio: 1.2 (ref 1.1–2.2)
ALT (SGPT): 18 U/L (ref 12–78)
AST (SGOT): 11 U/L — ABNORMAL LOW (ref 15–37)
Albumin: 4.1 g/dL (ref 3.5–5.0)
Alk. phosphatase: 74 U/L (ref 45–117)
Anion gap: 9 mmol/L (ref 5–15)
BUN/Creatinine ratio: 22 — ABNORMAL HIGH (ref 12–20)
BUN: 17 MG/DL (ref 6–20)
Bilirubin, total: 0.5 MG/DL (ref 0.2–1.0)
CO2: 25 mmol/L (ref 21–32)
Calcium: 9.5 MG/DL (ref 8.5–10.1)
Chloride: 108 mmol/L (ref 97–108)
Creatinine: 0.77 MG/DL (ref 0.45–1.15)
GFR est AA: >60 mL/min/{1.73_m2} (ref >60)
GFR est non-AA: >60 mL/min/{1.73_m2} (ref >60)
Globulin: 3.4 g/dL (ref 2.0–4.0)
Glucose: 95 mg/dL (ref 65–100)
Potassium: 3.8 mmol/L (ref 3.5–5.1)
Protein, total: 7.5 g/dL (ref 6.4–8.2)
Sodium: 142 mmol/L (ref 136–145)

## 2013-06-08 LAB — HCG URINE, QL. - POC: Pregnancy test,urine (POC): NEGATIVE

## 2013-06-08 LAB — BETA HCG, QT
Beta HCG, QT: 4 m[IU]/mL (ref 0–6)
hCG Quant: 4 m[IU]/mL (ref 0–6)

## 2013-06-08 LAB — TYPE AND SCREEN
ABO/Rh: A POS
Antibody Screen: NEGATIVE

## 2013-06-08 MED ADMIN — naproxen (NAPROSYN) tablet 500 mg: ORAL | @ 07:00:00 | NDC 68462018801

## 2013-06-08 MED ADMIN — cetirizine (ZYRTEC) tablet 10 mg: ORAL | @ 07:00:00 | NDC 51079059701

## 2013-06-08 MED ADMIN — levofloxacin (LEVAQUIN) tablet 500 mg: ORAL | @ 07:00:00 | NDC 68084048211

## 2013-06-08 MED ADMIN — ondansetron (ZOFRAN) injection 4 mg: INTRAVENOUS | @ 06:00:00 | NDC 00409475503

## 2013-06-08 NOTE — ED Notes (Signed)
Discharge instructions given by Dr Lourdes Sledge, pt leaves amb in NAD.

## 2013-06-08 NOTE — ED Provider Notes (Signed)
HPI Comments: Toni Martinez is a 22 y.o. Pregnant female (3 weeks; G4 P2)  to ED c/o vaginal bleeding x 3 days. Pt reports she has been spotting and using approximately 5-6 pads per day. Pt states she is not soaking the pads, but notes she has seen clots. Pt reports she was seen in the ED for a cough several weeks ago, where she was told she was pregnant. Pt states she was seen recently at [facility] ED for a cough, where her beta level was 55 and an [place] did not show evidence of an IUP. Pt notes she has not seen an OB/GYN or a PCP.       PMHx Significant For: Pt denies  PSHx Significant For: cholecystectomy, C-section   Social Hx: - tobacco, - EtOH, - illicit drugs    There are no other complaints, changes or physical findings at this time.     Written by Phineas Douglas, ED Scribe; as dictated by Mellody Life, MD.     The history is provided by the patient.        History reviewed. No pertinent past medical history.     Past Surgical History   Procedure Laterality Date   ??? Hx cholecystectomy     ??? Hx cesarean section           History reviewed. No pertinent family history.     History     Social History   ??? Marital Status: SINGLE     Spouse Name: N/A     Number of Children: N/A   ??? Years of Education: N/A     Occupational History   ??? Not on file.     Social History Main Topics   ??? Smoking status: Never Smoker    ??? Smokeless tobacco: Not on file   ??? Alcohol Use: No   ??? Drug Use: Not on file   ??? Sexually Active: Not on file     Other Topics Concern   ??? Not on file     Social History Narrative   ??? No narrative on file                  ALLERGIES: Pcn      Review of Systems   Constitutional: Negative.  Negative for fever and chills.   HENT: Negative.  Negative for congestion, sore throat, rhinorrhea, sneezing and neck stiffness.    Eyes: Negative.  Negative for redness and visual disturbance.   Respiratory: Negative.  Negative for shortness of breath.    Cardiovascular: Negative.  Negative for chest pain and leg swelling.    Gastrointestinal: Negative.  Negative for nausea, vomiting and abdominal pain.   Genitourinary: Positive for vaginal bleeding. Negative for frequency and difficulty urinating.   Musculoskeletal: Negative.  Negative for myalgias and back pain.   Skin: Negative.  Negative for rash.   Neurological: Negative.  Negative for dizziness, syncope, weakness and headaches.   Hematological: Negative for adenopathy.   All other systems reviewed and are negative.        Filed Vitals:    06/08/13 0057   BP: 129/91   Pulse: 77   Temp: 98.1 ??F (36.7 ??C)   Resp: 18   Height: 5\' 4"  (1.626 m)   Weight: 92.8 kg (204 lb 9.4 oz)   SpO2: 98%            Physical Exam   Nursing note and vitals reviewed.  Constitutional: She is oriented to person,  place, and time. She appears well-developed and well-nourished. No distress.   HENT:   Head: Normocephalic and atraumatic.   Nose: Nose normal.   Eyes: Conjunctivae and EOM are normal. No scleral icterus.   Neck: Normal range of motion. No tracheal deviation present.   Cardiovascular: Normal rate, regular rhythm, normal heart sounds and intact distal pulses.  Exam reveals no friction rub.    No murmur heard.  Pulmonary/Chest: Effort normal and breath sounds normal. No stridor. No respiratory distress. She has no wheezes. She has no rales.   Abdominal: Soft. Bowel sounds are normal. She exhibits no distension. There is no tenderness. There is no rebound.   Genitourinary: Uterus normal. There is no rash, tenderness, lesion or injury on the right labia. There is no rash, tenderness, lesion or injury on the left labia. Cervix exhibits no motion tenderness, no discharge and no friability. Right adnexum displays no mass, no tenderness and no fullness. Left adnexum displays no mass, no tenderness and no fullness. There is bleeding around the vagina. No tenderness around the vagina. No foreign body around the vagina. No vaginal discharge found.   Musculoskeletal: Normal range of motion. She exhibits no  tenderness.   Neurological: She is alert and oriented to person, place, and time. No cranial nerve deficit.   Skin: Skin is warm and dry. No rash noted. She is not diaphoretic.   Psychiatric: She has a normal mood and affect. Her behavior is normal. Judgment and thought content normal.        MDM     Differential Diagnosis; Clinical Impression; Plan:     DDX:  Miscarriage, ectopic, uti    Plan:  Labs, type and screen, upt, total hcg    Pt is A+, no rhogam indicated    Impression:  Miscarriage, uti      Amount and/or Complexity of Data Reviewed:   Clinical lab tests:  Ordered and reviewed   Review and summarize past medical records:  Yes  Progress:   Patient progress:  Stable      Procedures    2:59 AM  Progress note:  Discussed lab findings with pt, noted + miscarriage based on hcg level at 4 when it was 55 on 10/1. Discussed treatment for apparent UTI today and f/u with clinic or PCP/OB/GYN of choice. Agrees with plan for dc home.  Mellody Life, MD      LABORATORY TESTS:  Recent Results (from the past 12 hour(s))   CBC WITH AUTOMATED DIFF    Collection Time     06/08/13  1:20 AM       Result Value Range    WBC 6.4  3.6 - 11.0 K/uL    RBC 4.17  3.80 - 5.20 M/uL    HGB 10.5 (*) 11.5 - 16.0 g/dL    HCT 16.1 (*) 09.6 - 47.0 %    MCV 79.9 (*) 80.0 - 99.0 FL    MCH 25.2 (*) 26.0 - 34.0 PG    MCHC 31.5  30.0 - 36.5 g/dL    RDW 04.5 (*) 40.9 - 14.5 %    PLATELET 230  150 - 400 K/uL    NEUTROPHILS 54  32 - 75 %    LYMPHOCYTES 36  12 - 49 %    MONOCYTES 8  5 - 13 %    EOSINOPHILS 2  0 - 7 %    BASOPHILS 0  0 - 1 %    ABS. NEUTROPHILS 3.5  1.8 -  8.0 K/UL    ABS. LYMPHOCYTES 2.3  0.8 - 3.5 K/UL    ABS. MONOCYTES 0.5  0.0 - 1.0 K/UL    ABS. EOSINOPHILS 0.1  0.0 - 0.4 K/UL    ABS. BASOPHILS 0.0  0.0 - 0.1 K/UL   METABOLIC PANEL, COMPREHENSIVE    Collection Time     06/08/13  1:20 AM       Result Value Range    Sodium 142  136 - 145 mmol/L    Potassium 3.8  3.5 - 5.1 mmol/L    Chloride 108  97 - 108 mmol/L    CO2 25  21 - 32  mmol/L    Anion gap 9  5 - 15 mmol/L    Glucose 95  65 - 100 mg/dL    BUN 17  6 - 20 MG/DL    Creatinine 0.98  1.19 - 1.15 MG/DL    BUN/Creatinine ratio 22 (*) 12 - 20      GFR est AA >60  >60 ml/min/1.71m2    GFR est non-AA >60  >60 ml/min/1.36m2    Calcium 9.5  8.5 - 10.1 MG/DL    Bilirubin, total 0.5  0.2 - 1.0 MG/DL    ALT 18  12 - 78 U/L    AST 11 (*) 15 - 37 U/L    Alk. phosphatase 74  45 - 117 U/L    Protein, total 7.5  6.4 - 8.2 g/dL    Albumin 4.1  3.5 - 5.0 g/dL    Globulin 3.4  2.0 - 4.0 g/dL    A-G Ratio 1.2  1.1 - 2.2     URINALYSIS W/ REFLEX CULTURE    Collection Time     06/08/13  1:20 AM       Result Value Range    Color DARK YELLOW      Appearance TURBID (*) CLEAR      Specific gravity 1.025  1.003 - 1.030      pH (UA) 7.0  5.0 - 8.0      Protein 100 (*) NEG mg/dL    Glucose NEGATIVE   NEG mg/dL    Ketone TRACE (*) NEG mg/dL    Bilirubin SMALL (*) NEG      Blood LARGE (*) NEG      Urobilinogen 2.0 (*) 0.2 - 1.0 EU/dL    Nitrites NEGATIVE   NEG      Leukocyte Esterase MODERATE (*) NEG      WBC 50-100  0 - 4 /hpf    RBC >100 (*) 0 - 5 /hpf    Epithelial cells MODERATE (*) FEW /lpf    Bacteria 3+ (*) NEG /hpf    UA:UC IF INDICATED URINE CULTURE ORDERED (*) CNI      Mucus 2+ (*) NEG /lpf    Amorphous Crystals 1+ (*) NEG    Hyaline Cast 2-5  0 - 2   TYPE & SCREEN    Collection Time     06/08/13  1:20 AM       Result Value Range    Crossmatch Expiration 06/11/2013      ABO/Rh(D) A POSITIVE      Antibody screen NEG     TOTAL HCG, QT.    Collection Time     06/08/13  1:20 AM       Result Value Range    HCG, Qt. 4  0 - 6 MIU/ML   HCG URINE, QL. - POC    Collection  Time     06/08/13  1:20 AM       Result Value Range    Pregnancy test,urine (POC) NEGATIVE   NEG         MEDICATIONS GIVEN:  Medications   levofloxacin (LEVAQUIN) tablet 500 mg (not administered)   naproxen (NAPROSYN) tablet 500 mg (not administered)   cetirizine (ZYRTEC) tablet 10 mg (not administered)   ondansetron (ZOFRAN) injection 4 mg (4 mg  IntraVENous Given 06/08/13 0150)       IMPRESSION:  1. UTI (urinary tract infection)    2. Miscarriage    3. Pelvic cramping        PLAN:  1. Dc home  2. Levoquin, zofran, ultram  3. F/u with health department/gyn/clinic of choice  Return to ED if worse     3:04 AM  Annabelle Harman Veach's  results have been reviewed with her.  She has been counseled regarding her diagnosis.  She verbally conveys understanding and agreement of the signs, symptoms, diagnosis, treatment and prognosis and additionally agrees to follow up as recommended with Dr. None in 24 - 48 hours.  She also agrees with the care-plan and conveys that all of her questions have been answered.  I have also put together some discharge instructions for her that include: 1) educational information regarding their diagnosis, 2) how to care for their diagnosis at home, as well a 3) list of reasons why they would want to return to the ED prior to their follow-up appointment, should their condition change.

## 2013-06-08 NOTE — ED Notes (Signed)
Dr. Lourdes Sledge at the bedside.  Pelvic exam done.

## 2013-06-08 NOTE — ED Notes (Signed)
Pt. States that she is [redacted] weeks pregnant.  Has been seen x 3 for cramping and spotting.

## 2013-06-09 NOTE — Progress Notes (Signed)
Quick Note:    Pt dc on Levoquin, awaiting pending sensitivities.   Dede Query, PA    ______

## 2013-06-10 LAB — CULTURE, URINE
Colonies Counted: >100000
Colony Count: >100000

## 2013-06-27 NOTE — ED Notes (Signed)
Patient requested assistance with doffing and donning clothes r/t right arm pain.

## 2013-06-27 NOTE — ED Notes (Signed)
Patient c/o 4 day h/o right arm pain that starts in fingers and radiates to shoulder, advises a patient fell on her right wrist 4 days ago while she was working.  Patient described as "tingling, bee stings."

## 2013-06-27 NOTE — ED Notes (Signed)
PA Chrzanowski provided patient with verbal and written discharge instructions.

## 2013-06-27 NOTE — ED Provider Notes (Signed)
HPI Comments: 22 y.o. female presents ambulatory to the [facility] ED c/o 10/10 right wrist pain x 4 days. 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 Pt states that she is now having right wrist pain and a tingling sensation that radiates from her right wrist up into her right shoulder. Pt denies any neck pain currently. Pt denies any F/C, CP, SOB, N/V/D, rhinorrhea, congestion, sore throat, ear pain.     PCP: None  PMHx significant for: pt denies  PSHx significant for: cholecystectomy, C-section  Social Hx: - tobacco, - EtOH, - illicit drug use  Allergy: PCN    There are no other complaints, changes or physical findings at this time.   Written by Arline Asp, ED Scribe, as dictated by Gillermina Phy      The history is provided by the patient.        No past medical history on file.     Past Surgical History   Procedure Laterality Date   ??? Hx cholecystectomy     ??? Hx cesarean section           No family history on file.     History     Social History   ??? Marital Status: SINGLE     Spouse Name: N/A     Number of Children: N/A   ??? Years of Education: N/A     Occupational History   ??? Not on file.     Social History Main Topics   ??? Smoking status: Never Smoker    ??? Smokeless tobacco: Not on file   ??? Alcohol Use: No   ??? Drug Use: Not on file   ??? Sexually Active: Not on file     Other Topics Concern   ??? Not on file     Social History Narrative   ??? No narrative on file                  ALLERGIES: Pcn      Review of Systems   Constitutional: Negative for fever, chills, activity change and appetite change.   HENT: Negative for ear pain, congestion, sore throat, rhinorrhea and neck pain.    Respiratory: Negative for cough, shortness of breath and wheezing.    Cardiovascular: Negative for chest pain.   Gastrointestinal: Negative for nausea, vomiting, abdominal pain, diarrhea and constipation.   Musculoskeletal: Positive for arthralgias (right wrist pain).  Negative for back pain and joint swelling.   Skin: Negative for rash.   Neurological:        + tingling sensation that radiates from right wrist to right shoulder   All other systems reviewed and are negative.        Filed Vitals:    06/27/13 0732   BP: 155/84   Pulse: 98   Temp: 98.6 ??F (37 ??C)   Resp: 18   Height: 5\' 4"  (1.626 m)   Weight: 92.5 kg (203 lb 14.8 oz)   SpO2: 100%            Physical Exam   Nursing note and vitals reviewed.  Constitutional: She is oriented to person, place, and time. She appears well-developed and well-nourished. No distress.   HENT:   Head: Normocephalic and atraumatic.   Right Ear: Tympanic membrane and external ear normal.   Left Ear: Tympanic membrane and external ear normal.   Nose: Nose normal.   Mouth/Throat: Oropharynx is  clear and moist and mucous membranes are normal. No oropharyngeal exudate.   Eyes: Conjunctivae and EOM are normal. Pupils are equal, round, and reactive to light. Right eye exhibits no discharge. Left eye exhibits no discharge. No scleral icterus.   Neck: Normal range of motion. Neck supple. No JVD present. No tracheal deviation present. No thyromegaly present.   Cardiovascular: Normal rate, regular rhythm, normal heart sounds and intact distal pulses.  Exam reveals no gallop and no friction rub.    No murmur heard.  Good peripheral pulses.   Pulmonary/Chest: Effort normal and breath sounds normal. No stridor. No respiratory distress. She has no wheezes. She has no rales. She exhibits no tenderness.   Abdominal: Soft. Bowel sounds are normal. She exhibits no distension and no mass. There is no tenderness. There is no rebound and no guarding.   Musculoskeletal: Normal range of motion. She exhibits tenderness (right wrist). She exhibits no edema.   Decreased active and passive ROM to right wrist.   Lymphadenopathy:     She has no cervical adenopathy.   Neurological: She is alert and oriented to person, place, and time. She has normal strength and normal  reflexes. No cranial nerve deficit or sensory deficit. She exhibits normal muscle tone. Coordination and gait normal.   Neurovascularly intact.   Skin: Skin is warm and dry. No rash noted. She is not diaphoretic. No erythema. No pallor.   Psychiatric: She has a normal mood and affect.    Written by Arline Asp, ED Scribe, as dictated by Gillermina Phy.      MDM     Amount and/or Complexity of Data Reviewed:   Tests in the radiology section of CPT??:  Ordered and reviewed   Review and summarize past medical records:  Yes   Independant visualization of image, tracing, or specimen:  Yes  Progress:   Patient progress:  Stable      Procedures    Progress Note:  8:20 AM  Pt's clinical correlations are consistent with X-ray findings. Will refer pt to Dr. Logan Bores (ortho).  Written by Arline Asp, ED Scribe, as dictated by Gillermina Phy.      IMAGING RESULTS:  XR WRIST RT AP/LAT/OBL (Final result)  Result time: 06/27/13 08:14:58      Final result by Rad Results In Edi (06/27/13 08:14:58)      Narrative:    **Final Report**      ICD Codes / Adm.Diagnosis: 842.09 120 / Other wrist sprain and strain arm   pain  Examination: CR WRIST MIN 3 VWS RT - [id number] - Jun 27 2013 7:50AM  Accession No: [id number]  Reason: Trauma      REPORT:  EXAM: CR WRIST MIN 3 VWS RT    INDICATION: Trauma    COMPARISON: None.    FINDINGS: Three views of the right wrist demonstrate tiny lucency in the   trapezium. The soft tissues are within normal limits.       IMPRESSION: There is question of a tiny lucency base of the trapezium could   represent a small fracture.          Signing/Reading Doctor: Doristine Counter [phone])   Approved: Doristine Counter [phone]) Jun 27 2013 8:12AM          IMPRESSION:  1. Wrist sprain and strain      PLAN:  1. F/U with PCP or local clinic as needed  2. F/U with Dr. Logan Bores in 2 days as needed  3.  DC home with Rx: Percocet   Return to ED if worse     DISCHARGE NOTE  8:23 AM  The patient has been  re-evaluated and is ready for discharge. Reviewed available results with patient. Counseled pt on diagnosis and care plan. Pt has expressed understanding, and all questions have been answered. Pt agrees with plan and agrees to F/U as recommended, or return to the ED if their sxs worsen. Discharge instructions have been provided and explained to the pt, along with reasons to return to the ED.  Written by Arline Asp, ED Scribe, as dictated by Gillermina Phy.

## 2013-06-28 NOTE — ED Provider Notes (Signed)
I was personally available for consultation in the emergency department.  I have reviewed the chart and agree with the documentation recorded by the MLP, including the assessment, treatment plan, and disposition.  Weylyn Ricciuti L Tore Carreker, MD

## 2013-09-28 NOTE — Progress Notes (Signed)
CM review of chart of patient with Hx of 6 ED visits in the past 6 months. Patient does not currently have health insurance, but states she has applied.    Patient states her complaints of pain today are related to injury she received while working at [employer] [facility] [facility]  She states she did report this injury to her employer.  She states she was instructed that today's ED visit would not be covered under Worker's Comp, but she came "anyway, because of the pain".  She states the doctor that has been following her since October, Dr. Renaee MundaStephen Leibovic, was not in office today.      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  CM called Dr. Blain PaisLeibovik's office to scheduled follow-up appointment, but she already has EMG scheduled for 09/29/2013 with Dr. Orvan Falconerampbell and will see Dr. Donna BernardLeibovik after that.  Appointment scheduled for 1:00PM.  Patient aware.    Ezra SitesKathy Smith, RN, BSN, ACM  ED Case Manager  [phone]

## 2013-09-28 NOTE — ED Notes (Signed)
Pt given written/verbal DC instructions by A. Johnson, PA. Pt had no other questions or concerns prior to DC. Pt ambulatory from ED with a steady gait. Pt in no obvious distress at time of DC.

## 2013-09-28 NOTE — ED Notes (Signed)
Pt arrived to the ED stating that she broke her wrist in October and that she has been on restrictions not to use her right hand at all.  She sees Dr. Braulio BoschLibovic for her wrist/hand.  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  States she did not seek any care until now.  States that she does not like Dr. Braulio BoschLibovic because he is never in his office and was unable to see her today.  Pt has a velvro wrist and thumb splint in place.  PT states that she has numbness and tingling in her hand that is not new but has been since injury in October.  Pt keeps stating "I am on restrictions and they keep assigning me to work where I have to use it."  Discussed that she may need to talk to her HR about that issue.  Call bell in reach and pt verbalized understanding of plan of care.

## 2013-09-28 NOTE — ED Provider Notes (Signed)
HPI Comments: Toni Martinez is a 23 y.o. female who presents ambulatory to [facility] ED with cc of pain at base of R thumb that radiates to medial aspect of R wrist. 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 She was holding onto a wheel chair when the patient she was working with rolled, forcing her to twist and extend her R wrist. Pt reports that she had her wrist splint on at the time of this injury, but notes that her thumb and wrist both moved during the event. Pt also c/o intermittent pointer finger tingling on R hand since wrist fracture, but states that it has become more frequent since re-injury 3 days ago. She also reports intermittent R arm pain, describing it as "stinging". She denies taking any pain medication today, but states she has been taking Motrin and Norco for pain management. Pt states she has been seeing an orthopedic specialist for wrist fracture follow-up. She denies current chance of pregnancy and states she is on the Depo-Provera shot.     Orthopedics: Rikki Spearing, MD    PMHx is significant for: miscarriage    PSHx is significant for: cholecystectomy, c-section  Social Hx: (-) Tobacco Use; (-) EtOH Use  Occupational Hx: Healthcare  Allergies: PCN    There are no other complaints, changes, or physical findings at this time.   Written by Guadlupe Spanish Guy Sandifer, ED Scribe, as dictated by Ritta Slot.        The history is provided by the patient.        Past Medical History   Diagnosis Date   ??? Miscarriage 06/2013        Past Surgical History   Procedure Laterality Date   ??? Hx cholecystectomy     ??? Hx cesarean section           History reviewed. No pertinent family history.     History     Social History   ??? Marital Status: SINGLE     Spouse Name: N/A     Number of Children: N/A   ??? Years of Education: N/A     Occupational History   ??? Not on file.     Social History Main Topics   ???  Smoking status: Never Smoker    ??? Smokeless tobacco: Not on file   ??? Alcohol Use: No   ??? Drug Use: Not on file   ??? Sexually Active: Yes     Birth Control/ Protection: Injection     Other Topics Concern   ??? Not on file     Social History Narrative   ??? No narrative on file        ALLERGIES: Pcn      Review of Systems   Constitutional: Negative.  Negative for fever, chills, activity change, appetite change, fatigue and unexpected weight change.   HENT: Negative.  Negative for hearing loss, congestion, rhinorrhea, sneezing, neck stiffness and voice change.    Eyes: Negative.  Negative for pain and visual disturbance.   Respiratory: Negative.  Negative for apnea, cough, choking, chest tightness and shortness of breath.    Cardiovascular: Negative.  Negative for chest pain and palpitations.   Gastrointestinal: Negative.  Negative for nausea, vomiting, abdominal pain, diarrhea, blood in stool and abdominal distention.   Genitourinary: Negative.  Negative for urgency, frequency, flank pain and difficulty urinating.   Musculoskeletal:  Positive for myalgias (R arm) and arthralgias (R wrist, R thumb). Negative for back pain.   Skin: Negative.  Negative for color change and rash.   Neurological: Negative.  Negative for dizziness, seizures, syncope, speech difficulty, weakness, numbness and headaches.        Positive for tingling in R hand   Hematological: Negative for adenopathy.   Psychiatric/Behavioral: Negative.  Negative for suicidal ideas, behavioral problems, dysphoric mood and agitation. The patient is not nervous/anxious.        Filed Vitals:    09/28/13 0851   BP: 137/54   Pulse: 113   Temp: 98.6 ??F (37 ??C)   Resp: 20   Height: 5\' 4"  (1.626 m)   Weight: 92.4 kg (203 lb 11.3 oz)   SpO2: 98%            Physical Exam   Nursing note and vitals reviewed.  Constitutional: She is oriented to person, place, and time. She appears well-developed and well-nourished. No distress.   WF with lip piercing   HENT:   Head:  Normocephalic and atraumatic.   Eyes: EOM are normal. Pupils are equal, round, and reactive to light.   Neck: Normal range of motion. Neck supple.   Cardiovascular: Normal rate, regular rhythm, normal heart sounds and intact distal pulses.  Exam reveals no friction rub.    No murmur heard.  Pulmonary/Chest: Effort normal and breath sounds normal. No respiratory distress. She has no wheezes. She has no rales. She exhibits no tenderness.   Abdominal: Soft. Bowel sounds are normal. She exhibits no distension. There is no tenderness. There is no rebound and no guarding.   Musculoskeletal: She exhibits tenderness. She exhibits no edema.   R hand- TTP of base of proximal phalanx of thumb along extensor tendon to wrist. FROM of wrist. NVI throughout. Capp refill brisk.   Neurological: She is alert and oriented to person, place, and time. She exhibits normal muscle tone. Coordination normal.   Skin: Skin is warm and dry. She is not diaphoretic. No pallor.   Psychiatric: She has a normal mood and affect. Her behavior is normal.        MDM     Differential Diagnosis; Clinical Impression; Plan:     DDx: thumb sprain, strain, contusion, ligament injury   Amount and/or Complexity of Data Reviewed:   Tests in the radiology section of CPT??:  Ordered and reviewed   Review and summarize past medical records:  Yes  Progress:   Patient progress:  Stable      Procedures    IMAGING RESULTS:  XR THUMB RT MIN 2 V (Final result)  Result time: 09/28/13 09:43:15      Final result by Rad Results In Edi (09/28/13 09:43:15)      Narrative:    **Final Report**      ICD Codes / Adm.Diagnosis: 140008 120 / Hand Pain Hand Pain  Examination: CR FINGER THUMB MIN 2 VWS RT - [id number] - Sep 28 2013 9:34AM  Accession No: [id number]  Reason: pain, injury      REPORT:  EXAM: CR FINGER THUMB MIN 2 VWS RT    INDICATION: pain, injury    COMPARISON: None.    FINDINGS: Three views of the right thumb demonstrate motion artifact on the   oblique view. The soft  tissues are within normal limits. No acute fracture   or dislocation. Joint spaces are within normal limits.      IMPRESSION: No evidence of fracture.  Signing/Reading Doctor: Ansel BongALEX SLEEKER [phone]([id number])   Approved: Ansel BongALEX SLEEKER [phone]([id number]) Sep 28 2013 9:41AM        IMPRESSION:  1. Thumb sprain, right, initial encounter      PLAN:  1. Rx: Norco  2. Follow-up with Rikki SpearingStephen J Leibovic, MD as scheduled tomorrow  3. Follow-up as scheduled for EMG  4. Wear splint for comfort and keep hand elevated. Avoid heavy lifting until seen and cleared by orthopedic specialist  Return to ED if worse     Discharge Note:  9:57 AM  The pt has been re-evaluated and is ready for discharge. Reviewed available results with pt. Counseled pt on diagnosis and care plan. Pt has expressed understanding, and all questions have been answered. Pt agrees with plan and agrees to follow up as recommended, or to return to the ED if their symptoms worsen. Discharge instructions have been provided and explained to the pt, along with reasons to return to the ED.  Written by Guadlupe SpanishJessica A. Guy Sandiferavensbergen, ED Scribe, as dictated by Ritta SlotPA-C Hiran Leard.

## 2013-09-29 NOTE — ED Provider Notes (Signed)
I was personally available for consultation in the emergency department.  I have reviewed the chart and agree with the documentation recorded by the MLP, including the assessment, treatment plan, and disposition.  Joette Schmoker, MD

## 2013-11-26 LAB — METABOLIC PANEL, COMPREHENSIVE
A-G Ratio: 1.1 (ref 1.1–2.2)
ALT (SGPT): 17 U/L (ref 12–78)
AST (SGOT): 9 U/L — ABNORMAL LOW (ref 15–37)
Albumin: 4.5 g/dL (ref 3.5–5.0)
Alk. phosphatase: 87 U/L (ref 45–117)
Anion gap: 10 mmol/L (ref 5–15)
BUN/Creatinine ratio: 23 — ABNORMAL HIGH (ref 12–20)
BUN: 18 MG/DL (ref 6–20)
Bilirubin, total: 0.4 MG/DL (ref 0.2–1.0)
CO2: 24 mmol/L (ref 21–32)
Calcium: 9.9 MG/DL (ref 8.5–10.1)
Chloride: 108 mmol/L (ref 97–108)
Creatinine: 0.79 MG/DL (ref 0.45–1.15)
GFR est AA: >60 mL/min/{1.73_m2} (ref >60)
GFR est non-AA: >60 mL/min/{1.73_m2} (ref >60)
Globulin: 4.1 g/dL — ABNORMAL HIGH (ref 2.0–4.0)
Glucose: 98 mg/dL (ref 65–100)
Potassium: 3.7 mmol/L (ref 3.5–5.1)
Protein, total: 8.6 g/dL — ABNORMAL HIGH (ref 6.4–8.2)
Sodium: 142 mmol/L (ref 136–145)

## 2013-11-26 LAB — CBC WITH AUTOMATED DIFF
ABS. BASOPHILS: 0 10*3/uL (ref 0.0–0.1)
ABS. EOSINOPHILS: 0.1 10*3/uL (ref 0.0–0.4)
ABS. LYMPHOCYTES: 2.5 10*3/uL (ref 0.8–3.5)
ABS. MONOCYTES: 0.5 10*3/uL (ref 0.0–1.0)
ABS. NEUTROPHILS: 6.2 10*3/uL (ref 1.8–8.0)
BASOPHILS: 0 % (ref 0–1)
EOSINOPHILS: 1 % (ref 0–7)
HCT: 41.4 % (ref 35.0–47.0)
HGB: 13.4 g/dL (ref 11.5–16.0)
LYMPHOCYTES: 27 % (ref 12–49)
MCH: 26.9 PG (ref 26.0–34.0)
MCHC: 32.4 g/dL (ref 30.0–36.5)
MCV: 83 FL (ref 80.0–99.0)
MONOCYTES: 6 % (ref 5–13)
NEUTROPHILS: 66 % (ref 32–75)
PLATELET: 239 10*3/uL (ref 150–400)
RBC: 4.99 M/uL (ref 3.80–5.20)
RDW: 14.7 % — ABNORMAL HIGH (ref 11.5–14.5)
WBC: 9.4 10*3/uL (ref 3.6–11.0)

## 2013-11-26 LAB — EKG, 12 LEAD, INITIAL
Atrial Rate: 93 {beats}/min
Calculated P Axis: 37 degrees
Calculated R Axis: 14 degrees
Calculated T Axis: 21 degrees
P-R Interval: 174 ms
Q-T Interval: 358 ms
QRS Duration: 72 ms
QTC Calculation (Bezet): 445 ms
Ventricular Rate: 93 {beats}/min

## 2013-11-26 LAB — CK W/ CKMB & INDEX
CK - MB: <0.5 NG/ML — ABNORMAL LOW (ref 0.5–3.6)
CK - MB: <0.5 NG/ML — ABNORMAL LOW (ref 0.5–3.6)
CK: 60 U/L (ref 26–192)
CK: 69 U/L (ref 26–192)

## 2013-11-26 LAB — MAGNESIUM: Magnesium: 2.2 mg/dL (ref 1.6–2.4)

## 2013-11-26 LAB — D DIMER: D-dimer: 0.18 mg/L FEU (ref 0.00–0.65)

## 2013-11-26 LAB — TROPONIN I
Troponin-I, Qt.: <0.04 ng/mL (ref <0.05)
Troponin-I, Qt.: <0.04 ng/mL (ref <0.05)

## 2013-11-26 LAB — D-DIMER, QUANTITATIVE: D-Dimer, Quant: 0.18 mg/L FEU (ref 0.00–0.65)

## 2013-11-26 MED ADMIN — sodium chloride 0.9 % bolus infusion 1,000 mL: INTRAVENOUS | @ 13:00:00 | NDC 00409798309

## 2013-11-26 MED FILL — SODIUM CHLORIDE 0.9 % IV: INTRAVENOUS | Qty: 1000

## 2013-11-26 NOTE — ED Notes (Signed)
Dr Ashwell reviewed discharge instructions with the patient.  The patient verbalized understanding.  All questions and concerns were addressed.  The patient is discharged ambulatory in the care of family members with instructions and prescriptions in hand.  Pt is alert and oriented x 4.  Respirations are clear and unlabored.

## 2013-11-26 NOTE — ED Notes (Signed)
Patient updated on results and plan of care. Patient ambulated to the bathroom with a steady gait.

## 2013-11-26 NOTE — ED Provider Notes (Signed)
HPI Comments: Toni Martinez is a 23 y.o. female presenting ambulatory to ED c/o intermittent episodes of palpitations described as "my heart is beating fast" x 3 days, with worsening of sxs since this AM. Pt reports having 2 episodes of sharp L-sided CP underneath her breast that lasted "seconds" early this AM, but denies any CP in the ED. Pt also complains of SOB. Pt denies hx of similar sxs. Pt denies any recent surgeries, but notes that she is currently on birth control with the Depo-Provera injection. Pt also complains of nausea, 1 episode of vomiting just PTA, and 4 episodes of diarrhea since last PM. 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 Pt also complains of a dry cough, and notes having an episode of ABD pain earlier this AM that has now resolved. Pt denies F/C or leg swelling.     PMHx Significant For: miscarriage  PSHx Significant For: cholecystectomy, c-section  Social Hx: - tobacco (never), - EtOH, - illicit drugs      There are no other complaints, changes or physical findings at this time.     Written by Vonzell Schlatter Inamdar, ED Scribe, as dictated by Ocie Cornfield, MD.     The history is provided by the patient.        Past Medical History   Diagnosis Date   ??? Miscarriage 06/2013        Past Surgical History   Procedure Laterality Date   ??? Hx cholecystectomy     ??? Hx cesarean section           History reviewed. No pertinent family history.     History     Social History   ??? Marital Status: SINGLE     Spouse Name: N/A     Number of Children: N/A   ??? Years of Education: N/A     Occupational History   ??? Not on file.     Social History Main Topics   ??? Smoking status: Never Smoker    ??? Smokeless tobacco: Not on file   ??? Alcohol Use: No   ??? Drug Use: Not on file   ??? Sexual Activity: Yes     Birth Control/ Protection: Injection     Other Topics Concern   ??? Not on file     Social History Narrative                  ALLERGIES: Pcn      Review of Systems   Constitutional:  Negative for fever, chills and fatigue.   HENT: Negative for congestion, rhinorrhea and sore throat.    Eyes: Negative for pain, discharge and visual disturbance.   Respiratory: Positive for cough and shortness of breath. Negative for chest tightness and wheezing.    Cardiovascular: Positive for chest pain (2 episodes this AM, now resolved) and palpitations. Negative for leg swelling.   Gastrointestinal: Positive for nausea, vomiting, abdominal pain (this AM, now resolved) and diarrhea. Negative for constipation.   Genitourinary: Negative for dysuria, frequency and hematuria.   Musculoskeletal: Negative for myalgias, back pain and arthralgias.   Skin: Negative for rash.   Neurological: Negative for dizziness, weakness, light-headedness and headaches.   Psychiatric/Behavioral: Negative.        Filed Vitals:    11/26/13 0745 11/26/13 1000   BP: 132/84 137/71   Pulse: 98 75   Temp: 98.2 ??F (36.8 ??C)    Resp: 16 16   Height: 5'  4" (1.626 m)    Weight: 92.08 kg (203 lb)    SpO2: 99% 97%            Physical Exam   Constitutional: She is oriented to person, place, and time. She appears well-developed and well-nourished. No distress.   HENT:   Head: Normocephalic and atraumatic.   Eyes: EOM are normal. Right eye exhibits no discharge. Left eye exhibits no discharge. No scleral icterus.   Neck: Normal range of motion. Neck supple. No tracheal deviation present.   Cardiovascular: Normal rate, regular rhythm, normal heart sounds and intact distal pulses.  Exam reveals no gallop and no friction rub.    No murmur heard.  HR is in the 90s to low 100s   Pulmonary/Chest: Effort normal and breath sounds normal. No respiratory distress. She has no wheezes. She has no rales.   Abdominal: Soft. She exhibits no distension. There is no tenderness.   Musculoskeletal: Normal range of motion. She exhibits no edema.   No calf erythema, edema, or tenderness   Lymphadenopathy:     She has no cervical adenopathy.   Neurological: She is alert  and oriented to person, place, and time.   No focal neuro deficits   Skin: Skin is warm and dry. No rash noted.   Psychiatric: She has a normal mood and affect.   Written by Vonzell Schlatter Inamdar, ED Scribe, as dictated by Ocie Cornfield, MD.       MDM  Number of Diagnoses or Management Options  Atypical chest pain:   Palpitations:   Diagnosis management comments:     Patient appears well on exam, nontoxic.  Differential includes PVCs, arrhythmia, electrolyte abnormality, PE, ACS, pneumonia.  Patient reports abdominal pain this morning but this has resolved.  Abdominal exam is benign - do not suspect acute intraabdominal process.  Differential includes gastroenteritis, viral syndrome.  - CBC, CMP  - CEs  - D-dimer to risk stratify for PE  - CXR         Amount and/or Complexity of Data Reviewed  Clinical lab tests: ordered and reviewed  Tests in the radiology section of CPT??: ordered and reviewed  Tests in the medicine section of CPT??: ordered and reviewed  Review and summarize past medical records: yes  Independent visualization of images, tracings, or specimens: yes    Patient Progress  Patient progress: stable      Procedures    EKG interpretation: (Preliminary)  0741  Rhythm: normal sinus rhythm; and regular . Rate (approx.): 93; Axis: normal; P wave: normal; QRS interval: normal ; ST/T wave: T wave inverted; in  Lead: III.  Written by Vonzell Schlatter Inamdar, ED Scribe, as dictated by Ocie Cornfield, MD.    PROGRESS NOTE:  11:20 AM  Reviewed telemetry monitoring - no arrhythmia noted during ED course.  Work up unremarkable.  No n/v/d reported in ED.  Discussed results and follow up plan with patient.  Provided customary return to ED instructions.  Patient expressed understanding.  Rip Harbour, MD      LABORATORY TESTS:  Recent Results (from the past 12 hour(s))   CBC WITH AUTOMATED DIFF    Collection Time     11/26/13  7:50 AM       Result Value Ref Range    WBC 9.4  3.6 - 11.0 K/uL    RBC 4.99  3.80 - 5.20  M/uL    HGB 13.4  11.5 - 16.0 g/dL    HCT 41.4  35.0 - 47.0 %    MCV 83.0  80.0 - 99.0 FL    MCH 26.9  26.0 - 34.0 PG    MCHC 32.4  30.0 - 36.5 g/dL    RDW 14.7 (*) 11.5 - 14.5 %    PLATELET 239  150 - 400 K/uL    NEUTROPHILS 66  32 - 75 %    LYMPHOCYTES 27  12 - 49 %    MONOCYTES 6  5 - 13 %    EOSINOPHILS 1  0 - 7 %    BASOPHILS 0  0 - 1 %    ABS. NEUTROPHILS 6.2  1.8 - 8.0 K/UL    ABS. LYMPHOCYTES 2.5  0.8 - 3.5 K/UL    ABS. MONOCYTES 0.5  0.0 - 1.0 K/UL    ABS. EOSINOPHILS 0.1  0.0 - 0.4 K/UL    ABS. BASOPHILS 0.0  0.0 - 0.1 K/UL   CK W/ CKMB & INDEX    Collection Time     11/26/13  7:50 AM       Result Value Ref Range    CK 60  26 - 192 U/L    CK - MB <0.5 (*) 0.5 - 3.6 NG/ML    CK-MB Index CANNOT BE CALCULATED  0 - 2.5     METABOLIC PANEL, COMPREHENSIVE    Collection Time     11/26/13  7:50 AM       Result Value Ref Range    Sodium 142  136 - 145 mmol/L    Potassium 3.7  3.5 - 5.1 mmol/L    Chloride 108  97 - 108 mmol/L    CO2 24  21 - 32 mmol/L    Anion gap 10  5 - 15 mmol/L    Glucose 98  65 - 100 mg/dL    BUN 18  6 - 20 MG/DL    Creatinine 0.79  0.45 - 1.15 MG/DL    BUN/Creatinine ratio 23 (*) 12 - 20      GFR est AA >60  >60 ml/min/1.23m    GFR est non-AA >60  >60 ml/min/1.771m   Calcium 9.9  8.5 - 10.1 MG/DL    Bilirubin, total 0.4  0.2 - 1.0 MG/DL    ALT 17  12 - 78 U/L    AST 9 (*) 15 - 37 U/L    Alk. phosphatase 87  45 - 117 U/L    Protein, total 8.6 (*) 6.4 - 8.2 g/dL    Albumin 4.5  3.5 - 5.0 g/dL    Globulin 4.1 (*) 2.0 - 4.0 g/dL    A-G Ratio 1.1  1.1 - 2.2     TROPONIN I    Collection Time     11/26/13  7:50 AM       Result Value Ref Range    Troponin-I, Qt. <0.04  <0.05 ng/mL   D DIMER    Collection Time     11/26/13  7:50 AM       Result Value Ref Range    D-dimer 0.18  0.00 - 0.65 mg/L FEU   MAGNESIUM    Collection Time     11/26/13  7:50 AM       Result Value Ref Range    Magnesium 2.2  1.6 - 2.4 mg/dL   CK W/ CKMB & INDEX    Collection Time     11/26/13 10:18 AM       Result Value Ref Range  CK 69  26 - 192 U/L    CK - MB <0.5 (*) 0.5 - 3.6 NG/ML    CK-MB Index CANNOT BE CALCULATED  0 - 2.5     TROPONIN I    Collection Time     11/26/13 10:18 AM       Result Value Ref Range    Troponin-I, Qt. <0.04  <0.05 ng/mL       IMAGING RESULTS:    XR CHEST PA LAT (Final result) Result time: 11/26/13 08:35:22    Final result by Rad Results In Edi (11/26/13 08:35:22)    Narrative:    **Final Report**      ICD Codes / Adm.Diagnosis: 977414 140008 / Chest Pain Chest Pain  Examination: CR CHEST PA AND LATERAL - [id number] - Nov 26 2013 8:29AM  Accession No: [id number]  Reason: chest pain      REPORT:  PA AND LATERAL CHEST RADIOGRAPHS: 11/26/2013 8:29 AM    CLINICAL HISTORY: Chest pain.    COMPARISON: 06/03/2013, 05/27/2013.    TECHNIQUE: Frontal and lateral radiographs of the chest.    FINDINGS:  The lungs are clear. The central airways are patent. The heart size is   normal. No pneumothorax or pleural effusion. Post cholecystectomy.      IMPRESSION:   Clear lungs.          Signing/Reading Doctor: MATTHEW SOMERVILLE [phone])   Approved: MATTHEW SOMERVILLE [phone]) Nov 26 2013 8:33AM        MEDICATIONS GIVEN:  Medications   sodium chloride 0.9 % bolus infusion 1,000 mL (0 mL IntraVENous IV Completed 11/26/13 1054)       IMPRESSION:  1. Atypical chest pain    2. Palpitations        PLAN:   1. F/u with your PCP in 3 days  2. F/u with Dr. Colonel Bald (Cardiology) in 3 days to discuss Holter monitor  3. Return to ED if worse     DISCHARGE NOTE:  11:28 AM   The patient is ready for discharge. The patient's signs, symptoms, diagnosis, and discharge instructions have been discussed and the patient and/or family has conveyed their understanding. The patient and/or family is to follow up as recommended or return to the ER should their symptoms worsen. Plan has been discussed and the patient and/or family is in agreement.   Written by Vonzell Schlatter Inamdar, ED Scribe, as dictated by Ocie Cornfield, MD.

## 2013-11-26 NOTE — ED Notes (Signed)
Patient arrived through triage c/o chest pain with palpitations since last night. Patient is resting in bed, bed in low position, call bell in reach, monitor x3, visitor at bedside.

## 2014-01-25 LAB — URINALYSIS W/ REFLEX CULTURE
Bilirubin: NEGATIVE
Blood: NEGATIVE
Glucose: NEGATIVE mg/dL
Ketone: NEGATIVE mg/dL
Leukocyte Esterase: NEGATIVE
Nitrites: NEGATIVE
Protein: 30 mg/dL — AB
Specific gravity: 1.029 (ref 1.003–1.030)
Urobilinogen: 1 EU/dL (ref 0.2–1.0)
pH (UA): 6 (ref 5.0–8.0)

## 2014-01-25 LAB — HCG URINE, QL. - POC: Pregnancy test,urine (POC): NEGATIVE

## 2014-01-25 MED ORDER — KETOROLAC TROMETHAMINE 30 MG/ML INJECTION
30 mg/mL (1 mL) | INTRAMUSCULAR | Status: AC
Start: 2014-01-25 — End: 2014-01-25
  Administered 2014-01-25: 12:00:00 via INTRAMUSCULAR

## 2014-01-25 MED ORDER — CYCLOBENZAPRINE 10 MG TAB
10 mg | ORAL_TABLET | Freq: Three times a day (TID) | ORAL | Status: AC | PRN
Start: 2014-01-25 — End: ?

## 2014-01-25 MED ORDER — NAPROXEN 500 MG TAB
500 mg | ORAL_TABLET | Freq: Two times a day (BID) | ORAL | Status: AC
Start: 2014-01-25 — End: 2014-02-04

## 2014-01-25 MED ORDER — HYDROCODONE-ACETAMINOPHEN 7.5 MG-325 MG TAB
ORAL_TABLET | Freq: Four times a day (QID) | ORAL | Status: AC | PRN
Start: 2014-01-25 — End: ?

## 2014-01-25 MED FILL — KETOROLAC TROMETHAMINE 30 MG/ML INJECTION: 30 mg/mL (1 mL) | INTRAMUSCULAR | Qty: 2

## 2014-01-25 NOTE — ED Notes (Signed)
PA Koleen Nimrod discussed d/c instructions and information with pt.  Pt verbalized understanding.  Pt assisted out of ED into her car by RN via w/c.  No acute distress noted upon leave.

## 2014-01-25 NOTE — ED Provider Notes (Addendum)
HPI Comments: Toni Martinez is a 23 y.o. female with a PMhx significant for herniated discs in her lower back and sciatica presents ambulatory to [facility] ED with cc of new onset of constant lower back pain x last night that is slowly worsening. 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 Pt states lower back pain is worse on her R side and moving makes it worse. Pt states the pain was not immediate, however came on shortly after the lifting incident. Pt denies falling or blunt trauma. Pt reports associated sx's of tingling in her L foot but no weakness or numbness. Pt states when she injure her back in 2013 she was helping a resident get into the bed at a nursing home. Pt states she did physical therapy and was treated with cortisone injections for previous back injury. Pt denies having back surgery. Pt states she has been off of birth control x 3 months and her last period was irregular x 2 weeks. Pt reports she hasn't gone to the bathroom x 1 AM today. Pt [place] having back specialist in [place]. Pt denies urinary/fecal incontinence, F/C, cough, saddle paresthesia, being pregnant, or congestion.     Allergies: PCN    PMhx is significant for: miscarriage, herniated discs, sciatica  SMhx is significant for: cholecystectomy, C-section  Social hx: - Smoke, - EtOH, - Illicit Drugs    There are no other complaints, changes or physical findings at this time.  Written by Madelaine Etienne. Gean Quint, ED Scribe, as dictated by Ritta Slot.        The history is provided by the patient. No language interpreter was used.        Past Medical History   Diagnosis Date   ??? Miscarriage 06/2013        Past Surgical History   Procedure Laterality Date   ??? Hx cholecystectomy     ??? Hx cesarean section           History reviewed. No pertinent family history.     History     Social History   ??? Marital Status: SINGLE     Spouse Name: N/A     Number of Children: N/A   ??? Years of  Education: N/A     Occupational History   ??? Not on file.     Social History Main Topics   ??? Smoking status: Never Smoker    ??? Smokeless tobacco: Not on file   ??? Alcohol Use: No   ??? Drug Use: Not on file   ??? Sexual Activity: Yes     Birth Control/ Protection: Injection     Other Topics Concern   ??? Not on file     Social History Narrative                  ALLERGIES: Pcn      Review of Systems   Constitutional: Negative.  Negative for fever, chills, activity change, appetite change, fatigue and unexpected weight change.   HENT: Negative.  Negative for congestion, hearing loss, rhinorrhea, sneezing and voice change.    Eyes: Negative.  Negative for pain and visual disturbance.   Respiratory: Negative.  Negative for apnea, cough, choking, chest tightness and shortness of breath.    Cardiovascular: Negative.  Negative for chest pain and palpitations.   Gastrointestinal: Positive for abdominal pain (lower). Negative for nausea, vomiting, diarrhea, blood in stool and abdominal distention.  Denies fecal incontinence   Genitourinary: Negative.  Negative for urgency, frequency, flank pain and difficulty urinating.        No discharge, denies urinary incontinence   Musculoskeletal: Positive for back pain. Negative for myalgias, arthralgias and neck stiffness.   Skin: Negative.  Negative for color change and rash.   Neurological: Negative for dizziness, seizures, syncope, speech difficulty, weakness, numbness and headaches.        Tingling in L foot   Hematological: Negative for adenopathy.   Psychiatric/Behavioral: Negative.  Negative for suicidal ideas, behavioral problems, dysphoric mood and agitation. The patient is not nervous/anxious.        Filed Vitals:    01/25/14 0711   BP: 142/97   Pulse: 80   Temp: 97.9 ??F (36.6 ??C)   Resp: 16   Weight: 92.534 kg (204 lb)   SpO2: 100%            Physical Exam   Constitutional: She is oriented to person, place, and time. She appears well-developed and well-nourished. She is  active.  Non-toxic appearance. No distress.   Overweight white female   HENT:   Head: Normocephalic and atraumatic.   Lower lip piercing on the right   Eyes: Conjunctivae are normal.   Neck: Normal range of motion and full passive range of motion without pain. Neck supple. No tracheal tenderness present.   Cardiovascular: Normal rate, regular rhythm, S1 normal, S2 normal, normal heart sounds, intact distal pulses and normal pulses.  Exam reveals no gallop and no friction rub.    No murmur heard.  Pulmonary/Chest: Effort normal and breath sounds normal. No respiratory distress. She has no wheezes. She has no rales.   Abdominal: Soft. Bowel sounds are normal. She exhibits no distension. There is no tenderness. There is no rebound and no guarding.   Musculoskeletal: Normal range of motion. She exhibits tenderness (low to midline spinal process tenderness, BL paraspinal soft tissue tenderness). She exhibits no edema.   NVI, positive straight leg BL, strength 4/5 BL legs   Neurological: She is alert and oriented to person, place, and time. She has normal strength. No cranial nerve deficit or sensory deficit. Coordination normal.   Skin: Skin is warm, dry and intact. No abrasion and no rash noted. She is not diaphoretic. No erythema.   Psychiatric: She has a normal mood and affect. Her speech is normal and behavior is normal. Cognition and memory are normal.   Nursing note and vitals reviewed.  Written by Verdell FaceSean Nielsen, ED Scribe, as dictated by Ritta SlotPA-C Mitsuo Budnick.       MDM  Number of Diagnoses or Management Options  Diagnosis management comments: DDx: sciatica, lumbosacral strain, UTI, pregnancy, bulging discs, fracture, kidney stone       Amount and/or Complexity of Data Reviewed  Clinical lab tests: ordered and reviewed  Tests in the radiology section of CPT??: ordered and reviewed    Patient Progress  Patient progress: stable      Procedures    Progress Note:  8:50 AM  Pt reevaluated, pt was advised that if she is  uncomfortable walking then she should not be driving.   Written by Verdell FaceSean Nielsen, ED Scribe, as dictated by Ritta SlotPA-C Devynne Sturdivant.    DISCHARGE NOTE:  9:09 AM  The patient's results have been reviewed with them and/or available family. Patient and/or family verbally conveyed their understanding and agreement of the patient's signs, symptoms, diagnosis, treatment and prognosis and additionally agree to follow up as recommended  in the discharge instructions or to return to the Emergency Room should their condition change prior to their follow-up appointment. The patient/family verbally agrees with the care-plan and verbally conveys that all of their questions have been answered. The discharge instructions have also been provided to the patient and/or family with some educational information regarding the patient's diagnosis as well a list of reasons why the patient would want to return to the ER prior to their follow-up appointment, should their condition change.  Written by Ezekiel Slocumb, PA-C    LABS COMPLETED AND REVIEWED:  Recent Results (from the past 12 hour(s))   HCG URINE, QL. - POC    Collection Time     01/25/14  7:29 AM       Result Value Ref Range    Pregnancy test,urine (POC) NEGATIVE   NEG     URINALYSIS W/ REFLEX CULTURE    Collection Time     01/25/14  7:31 AM       Result Value Ref Range    Color YELLOW/STRAW      Appearance CLEAR  CLEAR      Specific gravity 1.029  1.003 - 1.030      pH (UA) 6.0  5.0 - 8.0      Protein 30 (*) NEG mg/dL    Glucose NEGATIVE   NEG mg/dL    Ketone NEGATIVE   NEG mg/dL    Bilirubin NEGATIVE   NEG      Blood NEGATIVE   NEG      Urobilinogen 1.0  0.2 - 1.0 EU/dL    Nitrites NEGATIVE   NEG      Leukocyte Esterase NEGATIVE   NEG      WBC 5-10  0 - 4 /hpf    RBC 0-5  0 - 5 /hpf    Epithelial cells MODERATE (*) FEW /lpf    Bacteria 1+ (*) NEG /hpf    UA:UC IF INDICATED URINE CULTURE ORDERED (*) CNI      Mucus 1+ (*) NEG /lpf     No fever, no flank pain, UA appears to be  contaminated, denies urinary sx's or hematuria. Sx's consistent with previous HNP. No bowel/bladder incontinence, leg / saddle numbness. Encouraged back rest, discussed safer back lifting practices, will give NSAIDs, muscle relaxant, narcotic and encoruaged ortho spine f/u or PCP this week if sx's persist. She chose to drive herself home.  Ezekiel Slocumb, PA-C      CLINICAL IMPRESSION:  1. Lumbosacral strain, initial encounter        Plan:  1. Norco, Flexeril, Naprosyn- side effects of each discussed  2. Rest, elevation of legs, ice x 48 hours then heat at 20 min intervals  3. PCP / clinic info given; ortho spine info given if sx's worsen  4. Return to ER if worse              I agree that the above documentation as written by ED Scribe is accurate and complete. Ezekiel Slocumb, PA-C

## 2014-01-27 LAB — CULTURE, URINE
Colonies Counted: 100000
Colony Count: 100000

## 2014-06-20 ENCOUNTER — Emergency Department: Payer: Self-pay | Admitting: Emergency Medicine

## 2014-06-20 LAB — COMPREHENSIVE METABOLIC PANEL
ALBUMIN: 3.5 g/dL (ref 3.4–5.0)
ANION GAP: 6 — AB (ref 7–16)
Alkaline Phosphatase: 63 U/L
BUN: 10 mg/dL (ref 7–18)
Bilirubin,Total: 0.7 mg/dL (ref 0.2–1.0)
CALCIUM: 8.5 mg/dL (ref 8.5–10.1)
CHLORIDE: 108 mmol/L — AB (ref 98–107)
Co2: 27 mmol/L (ref 21–32)
Creatinine: 0.7 mg/dL (ref 0.60–1.30)
EGFR (African American): >60
EGFR (Non-African Amer.): >60
GLUCOSE: 89 mg/dL (ref 65–99)
Osmolality: 280 (ref 275–301)
Potassium: 3.8 mmol/L (ref 3.5–5.1)
SGOT(AST): 18 U/L (ref 15–37)
SGPT (ALT): 16 U/L
Sodium: 141 mmol/L (ref 136–145)
TOTAL PROTEIN: 7 g/dL (ref 6.4–8.2)

## 2014-06-20 LAB — URINALYSIS, COMPLETE
BACTERIA: NONE SEEN
BILIRUBIN, UR: NEGATIVE
GLUCOSE, UR: NEGATIVE mg/dL (ref 0–75)
Ketone: NEGATIVE
Leukocyte Esterase: NEGATIVE
NITRITE: NEGATIVE
PROTEIN: NEGATIVE
Ph: 5 (ref 4.5–8.0)
RBC,UR: 6 /HPF (ref 0–5)
Specific Gravity: 1.019 (ref 1.003–1.030)
Squamous Epithelial: 12
WBC UR: 2 /HPF (ref 0–5)

## 2014-06-20 LAB — CBC
HCT: 40.8 % (ref 35.0–47.0)
HGB: 13.4 g/dL (ref 12.0–16.0)
MCH: 27.8 pg (ref 26.0–34.0)
MCHC: 33 g/dL (ref 32.0–36.0)
MCV: 84 fL (ref 80–100)
Platelet: 217 10*3/uL (ref 150–440)
RBC: 4.84 10*6/uL (ref 3.80–5.20)
RDW: 15.7 % — AB (ref 11.5–14.5)
WBC: 7.3 10*3/uL (ref 3.6–11.0)

## 2014-06-20 LAB — WET PREP, GENITAL

## 2014-06-21 LAB — GC/CHLAMYDIA PROBE AMP

## 2014-07-21 ENCOUNTER — Emergency Department: Payer: Self-pay | Admitting: Emergency Medicine

## 2014-08-11 ENCOUNTER — Emergency Department: Payer: Self-pay | Admitting: Emergency Medicine

## 2014-08-12 LAB — URINALYSIS, COMPLETE
BLOOD: NEGATIVE
Bilirubin,UR: NEGATIVE
Glucose,UR: NEGATIVE mg/dL (ref 0–75)
Ketone: NEGATIVE
NITRITE: POSITIVE
PH: 5 (ref 4.5–8.0)
Specific Gravity: 1.025 (ref 1.003–1.030)
Squamous Epithelial: 10
WBC UR: 37 /HPF (ref 0–5)

## 2014-08-14 LAB — URINE CULTURE

## 2014-10-22 ENCOUNTER — Emergency Department: Payer: Self-pay | Admitting: Emergency Medicine

## 2015-01-19 ENCOUNTER — Emergency Department: Payer: Self-pay

## 2015-01-19 ENCOUNTER — Emergency Department
Admission: EM | Admit: 2015-01-19 | Discharge: 2015-01-19 | Disposition: A | Discharge status: Home / Self Care | Payer: Self-pay | Attending: Emergency Medicine | Admitting: Emergency Medicine

## 2015-01-19 ENCOUNTER — Encounter: Payer: Self-pay | Admitting: Emergency Medicine

## 2015-01-19 DIAGNOSIS — G44019 Episodic cluster headache, not intractable: Secondary | ICD-10-CM | POA: Insufficient documentation

## 2015-01-19 DIAGNOSIS — G8929 Other chronic pain: Secondary | ICD-10-CM | POA: Insufficient documentation

## 2015-01-19 DIAGNOSIS — M545 Low back pain: Secondary | ICD-10-CM | POA: Insufficient documentation

## 2015-01-19 DIAGNOSIS — Z3202 Encounter for pregnancy test, result negative: Secondary | ICD-10-CM | POA: Insufficient documentation

## 2015-01-19 DIAGNOSIS — Z791 Long term (current) use of non-steroidal anti-inflammatories (NSAID): Secondary | ICD-10-CM | POA: Insufficient documentation

## 2015-01-19 DIAGNOSIS — Z880 Allergy status to penicillin: Secondary | ICD-10-CM | POA: Insufficient documentation

## 2015-01-19 DIAGNOSIS — Z79899 Other long term (current) drug therapy: Secondary | ICD-10-CM | POA: Insufficient documentation

## 2015-01-19 LAB — URINALYSIS COMPLETE WITH MICROSCOPIC (ARMC ONLY)
BACTERIA UA: NONE SEEN
BILIRUBIN URINE: NEGATIVE
GLUCOSE, UA: NEGATIVE mg/dL
Hgb urine dipstick: NEGATIVE
KETONES UR: NEGATIVE mg/dL
LEUKOCYTES UA: NEGATIVE
Nitrite: NEGATIVE
PH: 7 (ref 5.0–8.0)
Protein, ur: NEGATIVE mg/dL
Specific Gravity, Urine: 1.015 (ref 1.005–1.030)

## 2015-01-19 LAB — POCT PREGNANCY, URINE: PREG TEST UR: NEGATIVE

## 2015-01-19 MED ORDER — CYCLOBENZAPRINE HCL 5 MG PO TABS: 5.0000 mg | ORAL_TABLET | Freq: Three times a day (TID) | ORAL | Status: DC | PRN

## 2015-01-19 MED ORDER — KETOROLAC TROMETHAMINE 60 MG/2ML IM SOLN
60.0000 mg | Freq: Once | INTRAMUSCULAR | Status: AC
  Administered 2015-01-19: 60 mg via INTRAMUSCULAR

## 2015-01-19 MED ORDER — ONDANSETRON HCL 4 MG PO TABS: 4.0000 mg | ORAL_TABLET | Freq: Four times a day (QID) | ORAL | Status: DC | PRN

## 2015-01-19 MED ORDER — SUMATRIPTAN SUCCINATE 6 MG/0.5ML ~~LOC~~ SOLN
6.0000 mg | Freq: Once | SUBCUTANEOUS | Status: AC
  Administered 2015-01-19: 6 mg via SUBCUTANEOUS

## 2015-01-19 MED ORDER — ONDANSETRON 4 MG PO TBDP
ORAL_TABLET | ORAL | Status: AC
  Administered 2015-01-19: 4 mg via ORAL
  Filled 2015-01-19: qty 1

## 2015-01-19 MED ORDER — SUMATRIPTAN SUCCINATE 6 MG/0.5ML ~~LOC~~ SOLN
SUBCUTANEOUS | Status: AC
  Administered 2015-01-19: 6 mg via SUBCUTANEOUS
  Filled 2015-01-19: qty 0.5

## 2015-01-19 MED ORDER — ONDANSETRON 4 MG PO TBDP
4.0000 mg | ORAL_TABLET | Freq: Once | ORAL | Status: AC
  Administered 2015-01-19: 4 mg via ORAL

## 2015-01-19 MED ORDER — KETOROLAC TROMETHAMINE 10 MG PO TABS: 10.0000 mg | ORAL_TABLET | Freq: Three times a day (TID) | ORAL | Status: DC

## 2015-01-19 MED ORDER — KETOROLAC TROMETHAMINE 60 MG/2ML IM SOLN
INTRAMUSCULAR | Status: AC
  Administered 2015-01-19: 60 mg via INTRAMUSCULAR
  Filled 2015-01-19: qty 2

## 2015-01-19 NOTE — ED Provider Notes (Signed)
[facility] Emergency Department Provider Note ____________________________________________  Time seen: 6:19 PM on 01/19/2015 -----------------------------------------  I have reviewed the triage vital signs and the nursing notes.  HISTORY  Chief Complaint Headache  HPI Michaela Knapp is a 24 y.o. female who reports to the ED with 6 weeks of intermittent headache complaint. She describes that she has had nearly daily headaches over the last 4-6 weeks without known cause. She reports that today she woke up with some blurred vision" of nausea and vomiting last night. She has been dosing ibuprofen, Tylenol, and Excedrin for her headaches over the last few weeks. She describes a pounding, frontal right headache that has been slow to respond to over-the-counter medications. She denies any fever, chills, sweats, or syncope since the onset of her headaches. She notes her typical headaches do not occur with this frequency. She gives that medical history which includes chronic low back pain, and recurrent UTIs.  History reviewed. No pertinent past medical history.  There are no active problems to display for this patient.  History reviewed. No pertinent past surgical history.  Current Outpatient Rx  Name  Route  Sig  Dispense  Refill  . cyclobenzaprine (FLEXERIL) 5 MG tablet   Oral   Take 1 tablet (5 mg total) by mouth every 8 (eight) hours as needed for muscle spasms.   12 tablet   0   . diclofenac (VOLTAREN) 75 MG EC tablet   Oral   Take 75 mg by mouth 2 (two) times daily.         Marland Kitchen. ketorolac (TORADOL) 10 MG tablet   Oral   Take 1 tablet (10 mg total) by mouth every 8 (eight) hours.   15 tablet   0   . ondansetron (ZOFRAN) 4 MG tablet   Oral   Take 1 tablet (4 mg total) by mouth every 6 (six) hours as needed for nausea or vomiting.   15 tablet   0   . tapentadol (NUCYNTA) 50 MG TABS   Oral   Take 50 mg by mouth 3 (three) times daily.         .  traMADol (ULTRAM) 50 MG tablet   Oral   Take 50-100 mg by mouth every 6 (six) hours as needed. For pain.          Allergies Penicillins and Morphine and related  History reviewed. No pertinent family history.  Social History History  Substance Use Topics  . Smoking status: Never Smoker   . Smokeless tobacco: Not on file  . Alcohol Use: Yes     Comment: occassional   Review of Systems  Constitutional: Negative for fever. Eyes: Negative for visual changes. ENT: Negative for sore throat. Cardiovascular: Negative for chest pain. Respiratory: Negative for shortness of breath. Gastrointestinal: Negative for abdominal pain. Positive for vomiting and nausea. Genitourinary: Negative for dysuria. Musculoskeletal: Positive for back pain. Skin: Negative for rash. Neurological: Negative for focal weakness or numbness. Positive for headaches.  10-point ROS otherwise negative. ____________________________________________  PHYSICAL EXAM:  VITAL SIGNS: ED Triage Vitals  Enc Vitals Group     BP 01/19/15 1716 130/87 mmHg     Pulse Rate 01/19/15 1716 98     Resp 01/19/15 1716 20     Temp 01/19/15 1716 98.4 F (36.9 C)     Temp Source 01/19/15 1716 Oral     SpO2 01/19/15 1716 100 %     Weight 01/19/15 1716 207 lb (93.895 kg)  Height 01/19/15 1716 5\' 4"  (1.626 m)     Head Cir --      Peak Flow --      Pain Score 01/19/15 1717 7     Pain Loc --      Pain Edu? --      Excl. in GC? --    Constitutional: Alert and oriented. Well appearing and in no distress. Eyes: Conjunctivae are normal. PERRL. Normal extraocular movements. ENT   Head: Normocephalic and atraumatic.   Nose: No congestion/rhinnorhea.   Mouth/Throat: Mucous membranes are moist.   Neck: No stridor. Hematological/Lymphatic/Immunilogical: No cervical lymphadenopathy. Cardiovascular: Normal rate, regular rhythm. Normal and symmetric distal pulses are present in all extremities. No murmurs, rubs, or  gallops. Respiratory: Normal respiratory effort without tachypnea nor retractions. Breath sounds are clear and equal bilaterally. No wheezes/rales/rhonchi. Gastrointestinal: Soft and nontender. No distention. No abdominal bruits. There is no CVA tenderness. Genitourinary: deferred Musculoskeletal: Nontender with normal range of motion in all extremities. No joint effusions.  No lower extremity tenderness nor edema. Generalized low back pain.  Neurologic:  Normal speech and language. No gross focal neurologic deficits are appreciated. Speech is normal. No gait instability. Skin:  Skin is warm, dry and intact. No rash noted. Psychiatric: Mood and affect are normal. Speech and behavior are normal. Patient exhibits appropriate insight and judgment. ____________________________________________    LABS (pertinent positives/negatives)  Labs Reviewed  URINALYSIS COMPLETEWITH MICROSCOPIC ([facility])  - Abnormal; Notable for the following:    Color, Urine YELLOW (*)    APPearance HAZY (*)    Squamous Epithelial / LPF 6-30 (*)    All other components within normal limits  POC URINE PREG, ED  POCT PREGNANCY, URINE  ____________________________________________  RADIOLOGY  CT Head  IMPRESSION: Normal head CT. ____________________________________________  PROCEDURES  Procedure(s) performed: Toradol 60 mg IM              Zofran 4 mg ODT              Imitrex 6 mg SQ  Critical Care performed: No ____________________________________________  INITIAL IMPRESSION / ASSESSMENT AND PLAN / ED COURSE  Cluster-type headache with normal head CT scan. Results to patient regarding radiology and negative urinalysis.  Suggest treatment with prescription meds and follow-up with health department or pain management.   Pertinent labs & imaging results that were available during my care of the patient were reviewed by me and considered in my medical decision making (see chart for  details). ____________________________________________  FINAL CLINICAL IMPRESSION(S) / ED DIAGNOSES  Final diagnoses:  Episodic cluster headache, not intractable     Lissa HoardJenise V Bacon Suyash Amory, PA-C 01/19/15 2006  Darien Ramusavid W Kaminski, MD 01/20/15 0005

## 2015-01-19 NOTE — Discharge Instructions (Signed)
Cluster Headache Cluster headaches are recognized by their pattern of deep, intense head pain. They normally occur on one side of your head, but they may "switch sides" in subsequent episodes. Typically, cluster headaches:   Are severe in nature.   Occur repeatedly over weeks to months and are followed by periods of no headaches.   Can last from 15 minutes to 3 hours.   Occur at the same time each day, often at night.   Occur several times a day. CAUSES The exact cause of cluster headaches is not known. Alcohol use may be associated with cluster headaches. SIGNS AND SYMPTOMS   Severe pain that begins in or around your eye or temple.   One-sided head pain.   Feeling sick to your stomach (nauseous).   Sensitivity to light.   Runny nose.   Eye redness, tearing, and nasal stuffiness on the side of your head where you are experiencing pain.   Sweaty, pale skin of the face.   Droopy or swollen eyelid.   Restlessness. DIAGNOSIS  Cluster headaches are diagnosed based on symptoms and a physical exam. Your health care provider may order a CT scan or an MRI of your head or lab tests to see if your headaches are caused by other medical conditions.  TREATMENT   Medicines for pain relief and to prevent recurrent attacks. Some people may need a combination of medicines.  Oxygen for pain relief.   Biofeedback programs to help reduce headache pain.  It may be helpful to keep a headache diary. This may help you find a trend for what is triggering your headaches. Your health care provider can develop a treatment plan.  HOME CARE INSTRUCTIONS  During cluster periods:   Follow a regular sleep schedule. Do not vary the amount and time that you sleep from day to day. It is important to stay on the same schedule during a cluster period to help prevent headaches.   Avoid alcohol.   Stop smoking if you smoke.  SEEK MEDICAL CARE IF:  You have any changes from your previous  cluster headaches either in intensity or frequency.   You are not getting relief from medicines you are taking.  SEEK IMMEDIATE MEDICAL CARE IF:   You faint.   You have weakness or numbness, especially on one side of your body or face.   You have double vision.   You have nausea or vomiting that is not relieved within several hours.   You cannot keep your balance or have difficulty talking or walking.   You have neck pain or stiffness.   You have a fever. MAKE SURE YOU:  Understand these instructions.   Will watch your condition.   Will get help right away if you are not doing well or get worse. Document Released: 08/20/2005 Document Revised: 06/10/2013 Document Reviewed: 03/12/2013 [facility] Patient Information 2015 [place], [place]. This information is not intended to replace advice given to you by your health care provider. Make sure you discuss any questions you have with your health care provider.   Your exam, labs, and head CT are normal today.  You should take the prescription meds as directed. Follow-up with your provider or [facility] as needed.  Consider dosing Benadryl along with your prescription meds to abort headaches.

## 2015-01-19 NOTE — ED Notes (Signed)
Having intermittent headaches for  Several weeks.Marland Kitchen. Pos relief with excedrin. Also having lower back pain and urinary freq

## 2015-01-19 NOTE — ED Notes (Signed)
Pt c/o headache X 1 month, every day 8/10. Worse with light and noise. Pt took excedrin today with no relief. Nausea present, vision changes. Denies vomiting, confusion or slurred speech. PT also reports bilateral flank pain X1 week; pt thinks pain is from UTI. Pt asked to dim lights.  Pt alert and oriented X4, active, cooperative, pt in NAD. RR even and unlabored, color WNL.

## 2015-03-16 ENCOUNTER — Emergency Department
Admission: EM | Admit: 2015-03-16 | Discharge: 2015-03-16 | Disposition: A | Discharge status: Home / Self Care | Payer: Self-pay | Attending: Emergency Medicine | Admitting: Emergency Medicine

## 2015-03-16 ENCOUNTER — Encounter: Payer: Self-pay | Admitting: Emergency Medicine

## 2015-03-16 DIAGNOSIS — G8929 Other chronic pain: Secondary | ICD-10-CM | POA: Insufficient documentation

## 2015-03-16 DIAGNOSIS — N390 Urinary tract infection, site not specified: Secondary | ICD-10-CM | POA: Insufficient documentation

## 2015-03-16 DIAGNOSIS — Z880 Allergy status to penicillin: Secondary | ICD-10-CM | POA: Insufficient documentation

## 2015-03-16 DIAGNOSIS — Z3202 Encounter for pregnancy test, result negative: Secondary | ICD-10-CM | POA: Insufficient documentation

## 2015-03-16 DIAGNOSIS — Z79899 Other long term (current) drug therapy: Secondary | ICD-10-CM | POA: Insufficient documentation

## 2015-03-16 LAB — URINALYSIS COMPLETE WITH MICROSCOPIC (ARMC ONLY)
Bilirubin Urine: NEGATIVE
GLUCOSE, UA: NEGATIVE mg/dL
Hgb urine dipstick: NEGATIVE
KETONES UR: NEGATIVE mg/dL
Nitrite: NEGATIVE
Protein, ur: NEGATIVE mg/dL
SPECIFIC GRAVITY, URINE: 1.024 (ref 1.005–1.030)
pH: 6 (ref 5.0–8.0)

## 2015-03-16 MED ORDER — HYDROCODONE-ACETAMINOPHEN 5-325 MG PO TABS
2.0000 | ORAL_TABLET | Freq: Once | ORAL | Status: AC
  Administered 2015-03-16: 2 via ORAL
  Filled 2015-03-16: qty 2

## 2015-03-16 MED ORDER — TRAMADOL HCL 50 MG PO TABS: 50.0000 mg | ORAL_TABLET | Freq: Four times a day (QID) | ORAL | Status: DC | PRN

## 2015-03-16 MED ORDER — PHENAZOPYRIDINE HCL 100 MG PO TABS: 100.0000 mg | ORAL_TABLET | Freq: Three times a day (TID) | ORAL | Status: DC | PRN

## 2015-03-16 MED ORDER — LEVOFLOXACIN 500 MG PO TABS: 500.0000 mg | ORAL_TABLET | Freq: Every day | ORAL | Status: AC

## 2015-03-16 MED ORDER — LEVOFLOXACIN 500 MG PO TABS
500.0000 mg | ORAL_TABLET | Freq: Once | ORAL | Status: AC
Start: 2015-03-16 — End: 2015-03-16
  Administered 2015-03-16: 500 mg via ORAL
  Filled 2015-03-16: qty 1

## 2015-03-16 NOTE — Discharge Instructions (Signed)
Urinary Tract Infection Urinary tract infections (UTIs) can develop anywhere along your urinary tract. Your urinary tract is your body's drainage system for removing wastes and extra water. Your urinary tract includes two kidneys, two ureters, a bladder, and a urethra. Your kidneys are a pair of bean-shaped organs. Each kidney is about the size of your fist. They are located below your ribs, one on each side of your spine. CAUSES Infections are caused by microbes, which are microscopic organisms, including fungi, viruses, and bacteria. These organisms are so small that they can only be seen through a microscope. Bacteria are the microbes that most commonly cause UTIs. SYMPTOMS  Symptoms of UTIs may vary by age and gender of the patient and by the location of the infection. Symptoms in young women typically include a frequent and intense urge to urinate and a painful, burning feeling in the bladder or urethra during urination. Older women and men are more likely to be tired, shaky, and weak and have muscle aches and abdominal pain. A fever may mean the infection is in your kidneys. Other symptoms of a kidney infection include pain in your back or sides below the ribs, nausea, and vomiting. DIAGNOSIS To diagnose a UTI, your caregiver will ask you about your symptoms. Your caregiver also will ask to provide a urine sample. The urine sample will be tested for bacteria and white blood cells. White blood cells are made by your body to help fight infection. TREATMENT  Typically, UTIs can be treated with medication. Because most UTIs are caused by a bacterial infection, they usually can be treated with the use of antibiotics. The choice of antibiotic and length of treatment depend on your symptoms and the type of bacteria causing your infection. HOME CARE INSTRUCTIONS  If you were prescribed antibiotics, take them exactly as your caregiver instructs you. Finish the medication even if you feel better after you  have only taken some of the medication.  Drink enough water and fluids to keep your urine clear or pale yellow.  Avoid caffeine, tea, and carbonated beverages. They tend to irritate your bladder.  Empty your bladder often. Avoid holding urine for long periods of time.  Empty your bladder before and after sexual intercourse.  After a bowel movement, women should cleanse from front to back. Use each tissue only once. SEEK MEDICAL CARE IF:   You have back pain.  You develop a fever.  Your symptoms do not begin to resolve within 3 days. SEEK IMMEDIATE MEDICAL CARE IF:   You have severe back pain or lower abdominal pain.  You develop chills.  You have nausea or vomiting.  You have continued burning or discomfort with urination. MAKE SURE YOU:   Understand these instructions.  Will watch your condition.  Will get help right away if you are not doing well or get worse. Document Released: 05/30/2005 Document Revised: 02/19/2012 Document Reviewed: 09/28/2011 [facility] Patient Information 2015 [place], [place]. This information is not intended to replace advice given to you by your health care provider. Make sure you discuss any questions you have with your health care provider.    Please take your full course of antibiotics as prescribed. Return to the emergency department for any vomiting, fever, or worsening abdominal pain. Otherwise please follow up your primary care doctor in the next 2 days for recheck.

## 2015-03-16 NOTE — ED Notes (Signed)
Pt presents with left side flank pain started about a week ago.

## 2015-03-16 NOTE — ED Notes (Signed)
Pt presents with left sided flank and lower back pain x1 week. Pt reports (+) nausea, but no vomiting or diarrhea. Pt denies any fever, chest pain or shortness of breath. Pt reports the pain "feels like it's in my kidney" and "radiates down my left leg into her left foot, but states "it may be related to my sciatica". Pt is A&O, in NAD, calm and cooperative, texting on cell phone during initial assessment.

## 2015-03-16 NOTE — ED Notes (Addendum)
POC Urine Preg resulted= NEGATIVE 

## 2015-03-16 NOTE — ED Notes (Signed)
Bladder scan performed post voiding; residual urine amount of 86mL obtained. EDP notified.

## 2015-03-16 NOTE — ED Provider Notes (Signed)
[facility] Emergency Department Provider Note  Time seen: 7:54 PM  I have reviewed the triage vital signs and the nursing notes.   HISTORY  Chief Complaint Flank Pain    HPI Michaela Knapp is a 24 y.o. female with no past medical history who presents to the emergency department with left flank pain, urinary urgency, and foul smell. According to the patient for the past one week she has had intermittent left flank pain, lower abdominal pain, urinary urgency, and at times she is not able to get to the bathroom in time. She also states a very foul smell to her urine for the last 1 week. Patient does state nausea, but denies any vomiting, diarrhea, vaginal bleeding or vaginal discharge. Describes her flank pain is moderate, dull/aching.     History reviewed. No pertinent past medical history.  There are no active problems to display for this patient.   Past Surgical History  Procedure Laterality Date  . Cholecystectomy    . Abdominal surgery      Current Outpatient Rx  Name  Route  Sig  Dispense  Refill  . cyclobenzaprine (FLEXERIL) 5 MG tablet   Oral   Take 1 tablet (5 mg total) by mouth every 8 (eight) hours as needed for muscle spasms.   12 tablet   0   . diclofenac (VOLTAREN) 75 MG EC tablet   Oral   Take 75 mg by mouth 2 (two) times daily.         Marland Kitchen. ketorolac (TORADOL) 10 MG tablet   Oral   Take 1 tablet (10 mg total) by mouth every 8 (eight) hours.   15 tablet   0   . ondansetron (ZOFRAN) 4 MG tablet   Oral   Take 1 tablet (4 mg total) by mouth every 6 (six) hours as needed for nausea or vomiting.   15 tablet   0   . tapentadol (NUCYNTA) 50 MG TABS   Oral   Take 50 mg by mouth 3 (three) times daily.         . traMADol (ULTRAM) 50 MG tablet   Oral   Take 50-100 mg by mouth every 6 (six) hours as needed. For pain.           Allergies Penicillins and Morphine and related  No family history on file.  Social  History History  Substance Use Topics  . Smoking status: Never Smoker   . Smokeless tobacco: Not on file  . Alcohol Use: Yes     Comment: occassional    Review of Systems Constitutional: Negative for fever. Cardiovascular: Negative for chest pain. Respiratory: Negative for shortness of breath. Gastrointestinal:  Positive for left flank pain. Positive for lower abdominal pain. Positive for nausea, negative for vomiting or diarrhea. Genitourinary:  Denies dysuria, but states she does feel urgency when she urinates, and is having trouble fully emptying her bladder. Musculoskeletal:  Positive for lower back pain, the patient states she has chronic lower back pain that radiates down her left leg due to a herniated disc. Neurological: Negative for headache 10-point ROS otherwise negative.  ____________________________________________   PHYSICAL EXAM:  VITAL SIGNS: ED Triage Vitals  Enc Vitals Group     BP 03/16/15 1721 134/85 mmHg     Pulse Rate 03/16/15 1721 91     Resp 03/16/15 1721 18     Temp 03/16/15 1721 98.6 F (37 C)     Temp Source 03/16/15 1721 Oral  SpO2 03/16/15 1721 96 %     Weight 03/16/15 1721 215 lb (97.523 kg)     Height 03/16/15 1721  (1.626 m)     Head Cir --      Peak Flow --      Pain Score 03/16/15 1722 7     Pain Loc --      Pain Edu? --      Excl. in GC? --     Constitutional: Alert and oriented. Well appearing and in no distress. Eyes: Normal exam ENT   Mouth/Throat: Mucous membranes are moist. Cardiovascular: Normal rate, regular rhythm. No murmurs, rubs, or gallops. Respiratory: Normal respiratory effort without tachypnea nor retractions. Breath sounds are clear and equal bilaterally. No wheezes/rales/rhonchi. Gastrointestinal:  Mild suprapubic tenderness palpation. No distention. No rebound or guarding. Moderate left CVA tenderness palpation. Musculoskeletal:  Moderate lower L-spine tenderness to palpation especially in the  paraspinal regions. Patient states this is largely chronic. Neurologic:  Normal speech and language. No gross focal neurologic deficits are appreciated. Speech is normal. Skin:  Skin is warm, dry and intact.  Psychiatric: Mood and affect are normal. Speech and behavior are normal.  ____________________________________________   INITIAL IMPRESSION / ASSESSMENT AND PLAN / ED COURSE  Pertinent labs & imaging results that were available during my care of the patient were reviewed by me and considered in my medical decision making (see chart for details).  Patient with symptoms most suggestive of a urinary tract infection. Patient's urinalysis shows 6-30 white blood cells with 1+ leukocyte esterase. Given her symptoms and mildly positive urinalysis, we will treat as a urinary tract infection. I will send a urine culture. We will also check a point of care urine pregnancy test and perform a post void bladder scan. Patient denies any fever, vomiting, do not believe blood work is warranted at this time.   Point of care pregnancy test is negative. Bladder scan is 87 mL. We will discharge the patient home with primary care follow-up. ____________________________________________   FINAL CLINICAL IMPRESSION(S) / ED DIAGNOSES  urinary tract infection   Minna Antis, MD 03/16/15 2035

## 2015-03-17 LAB — POCT PREGNANCY, URINE: Preg Test, Ur: NEGATIVE

## 2015-03-18 LAB — URINE CULTURE

## 2015-06-23 ENCOUNTER — Emergency Department
Admission: EM | Admit: 2015-06-23 | Discharge: 2015-06-23 | Disposition: A | Discharge status: Home / Self Care | Payer: Self-pay | Attending: Emergency Medicine | Admitting: Emergency Medicine

## 2015-06-23 ENCOUNTER — Encounter: Payer: Self-pay | Admitting: *Deleted

## 2015-06-23 DIAGNOSIS — S30861A Insect bite (nonvenomous) of abdominal wall, initial encounter: Secondary | ICD-10-CM | POA: Insufficient documentation

## 2015-06-23 DIAGNOSIS — W57XXXA Bitten or stung by nonvenomous insect and other nonvenomous arthropods, initial encounter: Secondary | ICD-10-CM | POA: Insufficient documentation

## 2015-06-23 DIAGNOSIS — S40861A Insect bite (nonvenomous) of right upper arm, initial encounter: Secondary | ICD-10-CM | POA: Insufficient documentation

## 2015-06-23 DIAGNOSIS — S60562A Insect bite (nonvenomous) of left hand, initial encounter: Secondary | ICD-10-CM | POA: Insufficient documentation

## 2015-06-23 DIAGNOSIS — S60561A Insect bite (nonvenomous) of right hand, initial encounter: Secondary | ICD-10-CM | POA: Insufficient documentation

## 2015-06-23 DIAGNOSIS — Z791 Long term (current) use of non-steroidal anti-inflammatories (NSAID): Secondary | ICD-10-CM | POA: Insufficient documentation

## 2015-06-23 DIAGNOSIS — Z79899 Other long term (current) drug therapy: Secondary | ICD-10-CM | POA: Insufficient documentation

## 2015-06-23 DIAGNOSIS — Z880 Allergy status to penicillin: Secondary | ICD-10-CM | POA: Insufficient documentation

## 2015-06-23 DIAGNOSIS — S40862A Insect bite (nonvenomous) of left upper arm, initial encounter: Secondary | ICD-10-CM | POA: Insufficient documentation

## 2015-06-23 DIAGNOSIS — N3001 Acute cystitis with hematuria: Secondary | ICD-10-CM | POA: Insufficient documentation

## 2015-06-23 DIAGNOSIS — Y9389 Activity, other specified: Secondary | ICD-10-CM | POA: Insufficient documentation

## 2015-06-23 DIAGNOSIS — Y9289 Other specified places as the place of occurrence of the external cause: Secondary | ICD-10-CM | POA: Insufficient documentation

## 2015-06-23 DIAGNOSIS — Y998 Other external cause status: Secondary | ICD-10-CM | POA: Insufficient documentation

## 2015-06-23 LAB — URINALYSIS COMPLETE WITH MICROSCOPIC (ARMC ONLY)
Bilirubin Urine: NEGATIVE
Glucose, UA: NEGATIVE mg/dL
Ketones, ur: NEGATIVE mg/dL
Nitrite: POSITIVE — AB
Protein, ur: 30 mg/dL — AB
SPECIFIC GRAVITY, URINE: 1.023 (ref 1.005–1.030)
pH: 5 (ref 5.0–8.0)

## 2015-06-23 MED ORDER — HYDROCORTISONE 2.5 % EX OINT: TOPICAL_OINTMENT | Freq: Two times a day (BID) | CUTANEOUS | Status: DC

## 2015-06-23 MED ORDER — CIPROFLOXACIN HCL 500 MG PO TABS
ORAL_TABLET | ORAL | Status: AC
Start: 2015-06-23 — End: 2015-06-23
  Administered 2015-06-23: 500 mg via ORAL
  Filled 2015-06-23: qty 1

## 2015-06-23 MED ORDER — HYDROXYZINE HCL 25 MG PO TABS: ORAL_TABLET | ORAL | Status: DC

## 2015-06-23 MED ORDER — CIPROFLOXACIN HCL 500 MG PO TABS: 500.0000 mg | ORAL_TABLET | Freq: Two times a day (BID) | ORAL | Status: DC

## 2015-06-23 MED ORDER — HYDROXYZINE HCL 50 MG PO TABS
50.0000 mg | ORAL_TABLET | Freq: Once | ORAL | Status: AC
  Administered 2015-06-23: 50 mg via ORAL
  Filled 2015-06-23: qty 1

## 2015-06-23 MED ORDER — CIPROFLOXACIN HCL 500 MG PO TABS
500.0000 mg | ORAL_TABLET | Freq: Once | ORAL | Status: AC
  Administered 2015-06-23: 500 mg via ORAL

## 2015-06-23 MED ORDER — FAMOTIDINE 20 MG PO TABS
20.0000 mg | ORAL_TABLET | Freq: Once | ORAL | Status: AC
  Administered 2015-06-23: 20 mg via ORAL
  Filled 2015-06-23: qty 1

## 2015-06-23 NOTE — ED Notes (Signed)
Patient with no complaints at this time. Respirations even and unlabored. Skin warm/dry. Discharge instructions reviewed with patient at this time. Patient given opportunity to voice concerns/ask questions. Patient discharged at this time and left Emergency Department with steady gait.   

## 2015-06-23 NOTE — ED Provider Notes (Signed)
[facility] Emergency Department Provider Note ____________________________________________  Time seen: Approximately 7:25 PM  I have reviewed the triage vital signs and the nursing notes.   HISTORY  Chief Complaint Rash   HPI Michaela Knapp is a 24 y.o. female who presents to the emergency department for evaluation of what she believes to be bedbug bites. She states she noticed bites on her Tuesday evening. She recently moved into a new house, but the bed and couch are hers. She denies having any issues prior to moving into the new house and feels that the bedbugs were there when she moved in. She states she saw one of the bugs and "looked it up." She identified it as a bedbug. She complains of intense itching. She has tried Benadryl, hydrocortisone cream, and lemon juice without relief.   She also complains of dysuria and kidney pain. She has a history of UTI in the recent months.  History reviewed. No pertinent past medical history.  There are no active problems to display for this patient.   Past Surgical History  Procedure Laterality Date  . Cholecystectomy    . Abdominal surgery      c section    Current Outpatient Rx  Name  Route  Sig  Dispense  Refill  . ciprofloxacin (CIPRO) 500 MG tablet   Oral   Take 1 tablet (500 mg total) by mouth 2 (two) times daily.   10 tablet   0   . cyclobenzaprine (FLEXERIL) 5 MG tablet   Oral   Take 1 tablet (5 mg total) by mouth every 8 (eight) hours as needed for muscle spasms.   12 tablet   0   . diclofenac (VOLTAREN) 75 MG EC tablet   Oral   Take 75 mg by mouth 2 (two) times daily.         . hydrocortisone 2.5 % ointment   Topical   Apply topically 2 (two) times daily.   30 g   0   . hydrOXYzine (ATARAX/VISTARIL) 25 MG tablet      1-2 tablets 3 times per day as needed for itching   30 tablet   0   . ketorolac (TORADOL) 10 MG tablet   Oral   Take 1 tablet (10 mg total) by mouth every 8  (eight) hours.   15 tablet   0   . ondansetron (ZOFRAN) 4 MG tablet   Oral   Take 1 tablet (4 mg total) by mouth every 6 (six) hours as needed for nausea or vomiting.   15 tablet   0   . phenazopyridine (PYRIDIUM) 100 MG tablet   Oral   Take 1 tablet (100 mg total) by mouth 3 (three) times daily as needed for pain.   20 tablet   0   . tapentadol (NUCYNTA) 50 MG TABS   Oral   Take 50 mg by mouth 3 (three) times daily.         . traMADol (ULTRAM) 50 MG tablet   Oral   Take 1 tablet (50 mg total) by mouth every 6 (six) hours as needed.   20 tablet   0     Allergies Penicillins and Morphine and related  No family history on file.  Social History Social History  Substance Use Topics  . Smoking status: Never Smoker   . Smokeless tobacco: None  . Alcohol Use: No     Comment: occassional    Review of Systems   Constitutional: No  fever/chills Eyes: No visual changes. ENT: No congestion or rhinorrhea Cardiovascular: Denies chest pain. Respiratory: Denies shortness of breath. Gastrointestinal: No abdominal pain.  No nausea, no vomiting.  No diarrhea.  No constipation. Genitourinary: Negative for dysuria. Musculoskeletal: Negative for back pain. Skin: Positive for rash Neurological: Negative for headaches, focal weakness or numbness.  10-point ROS otherwise negative.  ____________________________________________   PHYSICAL EXAM:  VITAL SIGNS: ED Triage Vitals  Enc Vitals Group     BP 06/23/15 1911 134/95 mmHg     Pulse Rate 06/23/15 1911 94     Resp 06/23/15 1911 18     Temp 06/23/15 1911 98.3 F (36.8 C)     Temp Source 06/23/15 1911 Oral     SpO2 06/23/15 1911 100 %     Weight 06/23/15 1911 210 lb (95.255 kg)     Height 06/23/15 1911  (1.626 m)     Head Cir --      Peak Flow --      Pain Score 06/23/15 1912 7     Pain Loc --      Pain Edu? --      Excl. in GC? --    Constitutional: Alert and oriented. Well appearing and in no acute  distress. Eyes: Conjunctivae are normal. PERRL. EOMI. Head: Atraumatic. Nose: No congestion/rhinnorhea. Mouth/Throat: Mucous membranes are moist.  Oropharynx non-erythematous. No oral lesions. Neck: No stridor. Cardiovascular: Normal rate, regular rhythm.  Good peripheral circulation. Respiratory: Normal respiratory effort.  No retractions. Lungs CTAB. Gastrointestinal: Soft and nontender. No distention. No abdominal bruits.  Musculoskeletal: No lower extremity tenderness nor edema.  No joint effusions. Neurologic:  Normal speech and language. No gross focal neurologic deficits are appreciated. Speech is normal. No gait instability. Skin:  Erythematous, annular, raised, well defined boarder with pinpoint center which is consistent with bedbug bites over bilateral arms and hands and abdomen ; Negative for petechiae.  Psychiatric: Mood and affect are normal. Speech and behavior are normal.  ____________________________________________   LABS (all labs ordered are listed, but only abnormal results are displayed)  Labs Reviewed  URINALYSIS COMPLETEWITH MICROSCOPIC ([facility] ONLY) - Abnormal; Notable for the following:    Color, Urine YELLOW (*)    APPearance CLOUDY (*)    Hgb urine dipstick 3+ (*)    Protein, ur 30 (*)    Nitrite POSITIVE (*)    Leukocytes, UA 1+ (*)    Bacteria, UA FEW (*)    Squamous Epithelial / LPF 6-30 (*)    All other components within normal limits   ____________________________________________  EKG   ____________________________________________  RADIOLOGY   ____________________________________________   PROCEDURES  Procedure(s) performed: None ____________________________________________   INITIAL IMPRESSION / ASSESSMENT AND PLAN / ED COURSE  Pertinent labs & imaging results that were available during my care of the patient were reviewed by me and considered in my medical decision making (see chart for details).  Patient was advised to follow  up with the primary care provider for symptoms that are not improving over the next week. She was advised to return to the emergency department for symptoms that change or worsen if unable to schedule an appointment with the primary care provider or specialist.  Patient was given a prescription for hydroxyzine, hydrocortisone cream, and ciprofloxacin. ____________________________________________   FINAL CLINICAL IMPRESSION(S) / ED DIAGNOSES  Final diagnoses:  Acute cystitis with hematuria  Bedbug bite       Chinita Pester, FNP 06/23/15 2026  Darien Ramus, MD 06/23/15  2322 

## 2015-06-23 NOTE — ED Notes (Signed)
Patient states she moved into a new house on Monday and woke up Tuesday with a rash. Patient states she believes she has bed bugs.

## 2015-07-08 ENCOUNTER — Emergency Department
Admission: EM | Admit: 2015-07-08 | Discharge: 2015-07-08 | Disposition: A | Discharge status: Home / Self Care | Payer: Self-pay | Attending: Emergency Medicine | Admitting: Emergency Medicine

## 2015-07-08 DIAGNOSIS — Z880 Allergy status to penicillin: Secondary | ICD-10-CM | POA: Insufficient documentation

## 2015-07-08 DIAGNOSIS — M5432 Sciatica, left side: Secondary | ICD-10-CM

## 2015-07-08 DIAGNOSIS — Z79899 Other long term (current) drug therapy: Secondary | ICD-10-CM | POA: Insufficient documentation

## 2015-07-08 DIAGNOSIS — Z791 Long term (current) use of non-steroidal anti-inflammatories (NSAID): Secondary | ICD-10-CM | POA: Insufficient documentation

## 2015-07-08 DIAGNOSIS — Z792 Long term (current) use of antibiotics: Secondary | ICD-10-CM | POA: Insufficient documentation

## 2015-07-08 DIAGNOSIS — M5442 Lumbago with sciatica, left side: Secondary | ICD-10-CM | POA: Insufficient documentation

## 2015-07-08 MED ORDER — OXYCODONE-ACETAMINOPHEN 5-325 MG PO TABS
1.0000 | ORAL_TABLET | Freq: Once | ORAL | Status: AC
  Administered 2015-07-08: 1 via ORAL
  Filled 2015-07-08: qty 1

## 2015-07-08 MED ORDER — IBUPROFEN 800 MG PO TABS
ORAL_TABLET | ORAL | Status: AC
  Filled 2015-07-08: qty 1

## 2015-07-08 MED ORDER — KETOROLAC TROMETHAMINE 30 MG/ML IJ SOLN
60.0000 mg | Freq: Once | INTRAMUSCULAR | Status: AC
  Administered 2015-07-08: 60 mg via INTRAMUSCULAR
  Filled 2015-07-08: qty 2

## 2015-07-08 MED ORDER — DIAZEPAM 2 MG PO TABS: 2.0000 mg | ORAL_TABLET | Freq: Three times a day (TID) | ORAL | Status: DC | PRN

## 2015-07-08 MED ORDER — DIAZEPAM 2 MG PO TABS
2.0000 mg | ORAL_TABLET | Freq: Once | ORAL | Status: AC
  Administered 2015-07-08: 2 mg via ORAL
  Filled 2015-07-08: qty 1

## 2015-07-08 MED ORDER — IBUPROFEN 800 MG PO TABS
800.0000 mg | ORAL_TABLET | Freq: Once | ORAL | Status: AC
  Administered 2015-07-08: 800 mg via ORAL

## 2015-07-08 MED ORDER — IBUPROFEN 800 MG PO TABS: 800.0000 mg | ORAL_TABLET | Freq: Three times a day (TID) | ORAL | Status: DC | PRN

## 2015-07-08 NOTE — ED Notes (Signed)
Patient with known sciatica and known back injury in 2013 (two herniated discs). Patient presents tonight because she can't stand it anymore. When asked what changed to make her come in at this time of morning she said "I don't know I just need some sleep." Patient states she is unable to sleep due to restless legs which makes her legs and back pain worse. Patient will look up from phone and answer questions but answers most questions while texting. Leg circumference is equal bilaterally. No warmth, redness or swelling. Pulses intact. Skin temperature equal bilaterally.

## 2015-07-08 NOTE — ED Provider Notes (Signed)
[facility] Emergency Department Provider Note  ____________________________________________  Time seen: Approximately 6:26 AM  I have reviewed the triage vital signs and the nursing notes.   HISTORY  Chief Complaint Back Pain    HPI Michaela Knapp is a 24 y.o. female who presents to the ED from home with a chief complaint of lower back pain. Patient has a history of sciatica stemming from a back injury and 2013 with 2 herniated discs. Complains of a 2 day history of increased pain radiating into her left leg down to her toes associated with some numbness. Denies associated weakness, bowel or lateral incontinence, fever, chills, chest pain or shortness of breath. Denies recent travel or trauma. Nothing makes her symptoms better. Movement makes her symptoms worse.   Past medical history Sciatica Herniated disks   There are no active problems to display for this patient.   Past Surgical History  Procedure Laterality Date  . Cholecystectomy    . Abdominal surgery      c section    Current Outpatient Rx  Name  Route  Sig  Dispense  Refill  . ciprofloxacin (CIPRO) 500 MG tablet   Oral   Take 1 tablet (500 mg total) by mouth 2 (two) times daily.   10 tablet   0   . cyclobenzaprine (FLEXERIL) 5 MG tablet   Oral   Take 1 tablet (5 mg total) by mouth every 8 (eight) hours as needed for muscle spasms.   12 tablet   0   . diclofenac (VOLTAREN) 75 MG EC tablet   Oral   Take 75 mg by mouth 2 (two) times daily.         . hydrocortisone 2.5 % ointment   Topical   Apply topically 2 (two) times daily.   30 g   0   . hydrOXYzine (ATARAX/VISTARIL) 25 MG tablet      1-2 tablets 3 times per day as needed for itching   30 tablet   0   . ketorolac (TORADOL) 10 MG tablet   Oral   Take 1 tablet (10 mg total) by mouth every 8 (eight) hours.   15 tablet   0   . ondansetron (ZOFRAN) 4 MG tablet   Oral   Take 1 tablet (4 mg total) by mouth every 6  (six) hours as needed for nausea or vomiting.   15 tablet   0   . phenazopyridine (PYRIDIUM) 100 MG tablet   Oral   Take 1 tablet (100 mg total) by mouth 3 (three) times daily as needed for pain.   20 tablet   0   . tapentadol (NUCYNTA) 50 MG TABS   Oral   Take 50 mg by mouth 3 (three) times daily.         . traMADol (ULTRAM) 50 MG tablet   Oral   Take 1 tablet (50 mg total) by mouth every 6 (six) hours as needed.   20 tablet   0     Allergies Penicillins and Morphine and related  No family history on file.  Social History Social History  Substance Use Topics  . Smoking status: Never Smoker   . Smokeless tobacco: None  . Alcohol Use: Yes     Comment: occassional    Review of Systems Constitutional: No fever/chills Eyes: No visual changes. ENT: No sore throat. Cardiovascular: Denies chest pain. Respiratory: Denies shortness of breath. Gastrointestinal: No abdominal pain.  No nausea, no vomiting.  No diarrhea.  No constipation. Genitourinary: Negative for dysuria. Musculoskeletal: Positive for back pain. Skin: Negative for rash. Neurological: Negative for headaches, focal weakness or numbness.  10-point ROS otherwise negative.  ____________________________________________   PHYSICAL EXAM:  VITAL SIGNS: ED Triage Vitals  Enc Vitals Group     BP 07/08/15 0424 141/84 mmHg     Pulse --      Resp 07/08/15 0424 18     Temp 07/08/15 0424 98.6 F (37 C)     Temp Source 07/08/15 0424 Oral     SpO2 07/08/15 0424 95 %     Weight 07/08/15 0424 215 lb (97.523 kg)     Height 07/08/15 0424  (1.626 m)     Head Cir --      Peak Flow --      Pain Score 07/08/15 0424 7     Pain Loc --      Pain Edu? --      Excl. in GC? --     Constitutional: Asleep, easily awakened for exam. Alert and oriented. Well appearing and in no acute distress. Eyes: Conjunctivae are normal. PERRL. EOMI. Head: Atraumatic. Nose: No congestion/rhinnorhea. Mouth/Throat: Mucous  membranes are moist.  Oropharynx non-erythematous. Neck: No stridor.   Cardiovascular: Normal rate, regular rhythm. Grossly normal heart sounds.  Good peripheral circulation. Respiratory: Normal respiratory effort.  No retractions. Lungs CTAB. Gastrointestinal: Soft and nontender. No distention. No abdominal bruits. No CVA tenderness. Musculoskeletal: Left paraspinal lumbar muscle spasms. Tender to palpation left buttock. No lower extremity tenderness nor edema.  No joint effusions. Neurologic:  Normal speech and language. No gross focal neurologic deficits are appreciated. 5/5 motor strength and sensation bilaterally in all extremities. No gait instability. Skin:  Skin is warm, dry and intact. No rash noted. Psychiatric: Mood and affect are normal. Speech and behavior are normal.  ____________________________________________   LABS (all labs ordered are listed, but only abnormal results are displayed)  Labs Reviewed - No data to display ____________________________________________  EKG  None ____________________________________________  RADIOLOGY  None ____________________________________________   PROCEDURES  Procedure(s) performed: None  Critical Care performed: No  ____________________________________________   INITIAL IMPRESSION / ASSESSMENT AND PLAN / ED COURSE  Pertinent labs & imaging results that were available during my care of the patient were reviewed by me and considered in my medical decision making (see chart for details).  24 year old female who presents with acute left sciatica. Treat with NSAIDs, analgesia and muscle relaxer. Patient will follow-up with orthopedics. Strict return precautions given. Patient verbalizes understanding and agrees with plan of care. ____________________________________________   FINAL CLINICAL IMPRESSION(S) / ED DIAGNOSES  Final diagnoses:  Sciatica of left side      Irean Hong, MD 07/08/15 [phone]

## 2015-07-08 NOTE — Discharge Instructions (Signed)
1. You may take medicines as needed for pain and muscle spasms (Motrin/Valium #15). 2. Apply moist heat to affected area several times daily. 3. Return to the ER for worsening symptoms, persistent vomiting, difficulty breathing or other concerns.  Sciatica Sciatica is pain, weakness, numbness, or tingling along the path of the sciatic nerve. The nerve starts in the lower back and runs down the back of each leg. The nerve controls the muscles in the lower leg and in the back of the knee, while also providing sensation to the back of the thigh, lower leg, and the sole of your foot. Sciatica is a symptom of another medical condition. For instance, nerve damage or certain conditions, such as a herniated disk or bone spur on the spine, pinch or put pressure on the sciatic nerve. This causes the pain, weakness, or other sensations normally associated with sciatica. Generally, sciatica only affects one side of the body. CAUSES   Herniated or slipped disc.  Degenerative disk disease.  A pain disorder involving the narrow muscle in the buttocks (piriformis syndrome).  Pelvic injury or fracture.  Pregnancy.  Tumor (rare). SYMPTOMS  Symptoms can vary from mild to very severe. The symptoms usually travel from the low back to the buttocks and down the back of the leg. Symptoms can include:  Mild tingling or dull aches in the lower back, leg, or hip.  Numbness in the back of the calf or sole of the foot.  Burning sensations in the lower back, leg, or hip.  Sharp pains in the lower back, leg, or hip.  Leg weakness.  Severe back pain inhibiting movement. These symptoms may get worse with coughing, sneezing, laughing, or prolonged sitting or standing. Also, being overweight may worsen symptoms. DIAGNOSIS  Your caregiver will perform a physical exam to look for common symptoms of sciatica. He or she may ask you to do certain movements or activities that would trigger sciatic nerve pain. Other tests  may be performed to find the cause of the sciatica. These may include:  Blood tests.  X-rays.  Imaging tests, such as an MRI or CT scan. TREATMENT  Treatment is directed at the cause of the sciatic pain. Sometimes, treatment is not necessary and the pain and discomfort goes away on its own. If treatment is needed, your caregiver may suggest:  Over-the-counter medicines to relieve pain.  Prescription medicines, such as anti-inflammatory medicine, muscle relaxants, or narcotics.  Applying heat or ice to the painful area.  Steroid injections to lessen pain, irritation, and inflammation around the nerve.  Reducing activity during periods of pain.  Exercising and stretching to strengthen your abdomen and improve flexibility of your spine. Your caregiver may suggest losing weight if the extra weight makes the back pain worse.  Physical therapy.  Surgery to eliminate what is pressing or pinching the nerve, such as a bone spur or part of a herniated disk. HOME CARE INSTRUCTIONS   Only take over-the-counter or prescription medicines for pain or discomfort as directed by your caregiver.  Apply ice to the affected area for 20 minutes, 3-4 times a day for the first 48-72 hours. Then try heat in the same way.  Exercise, stretch, or perform your usual activities if these do not aggravate your pain.  Attend physical therapy sessions as directed by your caregiver.  Keep all follow-up appointments as directed by your caregiver.  Do not wear high heels or shoes that do not provide proper support.  Check your mattress to see if  it is too soft. A firm mattress may lessen your pain and discomfort. SEEK IMMEDIATE MEDICAL CARE IF:   You lose control of your bowel or bladder (incontinence).  You have increasing weakness in the lower back, pelvis, buttocks, or legs.  You have redness or swelling of your back.  You have a burning sensation when you urinate.  You have pain that gets worse when  you lie down or awakens you at night.  Your pain is worse than you have experienced in the past.  Your pain is lasting longer than 4 weeks.  You are suddenly losing weight without reason. MAKE SURE YOU:  Understand these instructions.  Will watch your condition.  Will get help right away if you are not doing well or get worse.   This information is not intended to replace advice given to you by your health care provider. Make sure you discuss any questions you have with your health care provider.   Document Released: 08/14/2001 Document Revised: 05/11/2015 Document Reviewed: 12/30/2011 Elsevier Interactive Patient Education [employer]2016 [employer]

## 2015-08-10 ENCOUNTER — Encounter: Payer: Self-pay | Admitting: Emergency Medicine

## 2015-08-10 ENCOUNTER — Emergency Department
Admission: EM | Admit: 2015-08-10 | Discharge: 2015-08-10 | Disposition: A | Discharge status: Home / Self Care | Payer: Self-pay | Attending: Emergency Medicine | Admitting: Emergency Medicine

## 2015-08-10 DIAGNOSIS — Z880 Allergy status to penicillin: Secondary | ICD-10-CM | POA: Insufficient documentation

## 2015-08-10 DIAGNOSIS — M5442 Lumbago with sciatica, left side: Secondary | ICD-10-CM | POA: Insufficient documentation

## 2015-08-10 DIAGNOSIS — M5432 Sciatica, left side: Secondary | ICD-10-CM

## 2015-08-10 HISTORY — DX: Sciatica, unspecified side: M54.30

## 2015-08-10 MED ORDER — HYDROCODONE-ACETAMINOPHEN 5-325 MG PO TABS
1.0000 | ORAL_TABLET | ORAL | Status: AC | PRN
Start: 2015-08-10 — End: ?

## 2015-08-10 MED ORDER — DIAZEPAM 5 MG/ML IJ SOLN
5.0000 mg | Freq: Once | INTRAMUSCULAR | Status: AC
Start: 2015-08-10 — End: 2015-08-10
  Administered 2015-08-10: 5 mg via INTRAMUSCULAR
  Filled 2015-08-10: qty 2

## 2015-08-10 MED ORDER — OXYCODONE-ACETAMINOPHEN 5-325 MG PO TABS
2.0000 | ORAL_TABLET | Freq: Once | ORAL | Status: AC
  Administered 2015-08-10: 2 via ORAL
  Filled 2015-08-10: qty 2

## 2015-08-10 MED ORDER — PREDNISONE 10 MG PO TABS: 50.0000 mg | ORAL_TABLET | Freq: Every day | ORAL | Status: AC

## 2015-08-10 MED ORDER — NAPROXEN 500 MG PO TABS: 500.0000 mg | ORAL_TABLET | Freq: Two times a day (BID) | ORAL | Status: AC

## 2015-08-10 MED ORDER — METHOCARBAMOL 750 MG PO TABS: 750.0000 mg | ORAL_TABLET | Freq: Four times a day (QID) | ORAL | Status: AC

## 2015-08-10 NOTE — ED Notes (Signed)
Pt reports has been diagnosed with sciatica approximately two months ago. States she has had multiple ER visits and is not getting any better.

## 2015-08-10 NOTE — Discharge Instructions (Signed)
Radicular Pain °Radicular pain in either the arm or leg is usually from a bulging or herniated disk in the spine. A piece of the herniated disk may press against the nerves as the nerves exit the spine. This causes pain which is felt at the tips of the nerves down the arm or leg. Other causes of radicular pain may include: °· Fractures. °· Heart disease. °· Cancer. °· An abnormal and usually degenerative state of the nervous system or nerves (neuropathy). °Diagnosis may require CT or MRI scanning to determine the primary cause.  °Nerves that start at the neck (nerve roots) may cause radicular pain in the outer shoulder and arm. It can spread down to the thumb and fingers. The symptoms vary depending on which nerve root has been affected. In most cases radicular pain improves with conservative treatment. Neck problems may require physical therapy, a neck collar, or cervical traction. Treatment may take many weeks, and surgery may be considered if the symptoms do not improve.  °Conservative treatment is also recommended for sciatica. Sciatica causes pain to radiate from the lower back or buttock area down the leg into the foot. Often there is a history of back problems. Most patients with sciatica are better after 2 to 4 weeks of rest and other supportive care. Short term bed rest can reduce the disk pressure considerably. Sitting, however, is not a good position since this increases the pressure on the disk. You should avoid bending, lifting, and all other activities which make the problem worse. Traction can be used in severe cases. Surgery is usually reserved for patients who do not improve within the first months of treatment. °Only take over-the-counter or prescription medicines for pain, discomfort, or fever as directed by your caregiver. Narcotics and muscle relaxants may help by relieving more severe pain and spasm and by providing mild sedation. Cold or massage can give significant relief. Spinal manipulation  is not recommended. It can increase the degree of disc protrusion. Epidural steroid injections are often effective treatment for radicular pain. These injections deliver medicine to the spinal nerve in the space between the protective covering of the spinal cord and back bones (vertebrae). Your caregiver can give you more information about steroid injections. These injections are most effective when given within two weeks of the onset of pain.  °You should see your caregiver for follow up care as recommended. A program for neck and back injury rehabilitation with stretching and strengthening exercises is an important part of management.  °SEEK IMMEDIATE MEDICAL CARE IF: °· You develop increased pain, weakness, or numbness in your arm or leg. °· You develop difficulty with bladder or bowel control. °· You develop abdominal pain. °  °This information is not intended to replace advice given to you by your health care provider. Make sure you discuss any questions you have with your health care provider. °  °Document Released: 09/27/2004 Document Revised: 09/10/2014 Document Reviewed: 03/16/2015 °Elsevier Interactive Patient Education ©2016 Elsevier Inc. ° °Sciatica °Sciatica is pain, weakness, numbness, or tingling along the path of the sciatic nerve. The nerve starts in the lower back and runs down the back of each leg. The nerve controls the muscles in the lower leg and in the back of the knee, while also providing sensation to the back of the thigh, lower leg, and the sole of your foot. Sciatica is a symptom of another medical condition. For instance, nerve damage or certain conditions, such as a herniated disk or bone spur on the   spine, pinch or put pressure on the sciatic nerve. This causes the pain, weakness, or other sensations normally associated with sciatica. Generally, sciatica only affects one side of the body. °CAUSES  °· Herniated or slipped disc. °· Degenerative disk disease. °· A pain disorder involving  the narrow muscle in the buttocks (piriformis syndrome). °· Pelvic injury or fracture. °· Pregnancy. °· Tumor (rare). °SYMPTOMS  °Symptoms can vary from mild to very severe. The symptoms usually travel from the low back to the buttocks and down the back of the leg. Symptoms can include: °· Mild tingling or dull aches in the lower back, leg, or hip. °· Numbness in the back of the calf or sole of the foot. °· Burning sensations in the lower back, leg, or hip. °· Sharp pains in the lower back, leg, or hip. °· Leg weakness. °· Severe back pain inhibiting movement. °These symptoms may get worse with coughing, sneezing, laughing, or prolonged sitting or standing. Also, being overweight may worsen symptoms. °DIAGNOSIS  °Your caregiver will perform a physical exam to look for common symptoms of sciatica. He or she may ask you to do certain movements or activities that would trigger sciatic nerve pain. Other tests may be performed to find the cause of the sciatica. These may include: °· Blood tests. °· X-rays. °· Imaging tests, such as an MRI or CT scan. °TREATMENT  °Treatment is directed at the cause of the sciatic pain. Sometimes, treatment is not necessary and the pain and discomfort goes away on its own. If treatment is needed, your caregiver may suggest: °· Over-the-counter medicines to relieve pain. °· Prescription medicines, such as anti-inflammatory medicine, muscle relaxants, or narcotics. °· Applying heat or ice to the painful area. °· Steroid injections to lessen pain, irritation, and inflammation around the nerve. °· Reducing activity during periods of pain. °· Exercising and stretching to strengthen your abdomen and improve flexibility of your spine. Your caregiver may suggest losing weight if the extra weight makes the back pain worse. °· Physical therapy. °· Surgery to eliminate what is pressing or pinching the nerve, such as a bone spur or part of a herniated disk. °HOME CARE INSTRUCTIONS  °· Only take  over-the-counter or prescription medicines for pain or discomfort as directed by your caregiver. °· Apply ice to the affected area for 20 minutes, 3-4 times a day for the first 48-72 hours. Then try heat in the same way. °· Exercise, stretch, or perform your usual activities if these do not aggravate your pain. °· Attend physical therapy sessions as directed by your caregiver. °· Keep all follow-up appointments as directed by your caregiver. °· Do not wear high heels or shoes that do not provide proper support. °· Check your mattress to see if it is too soft. A firm mattress may lessen your pain and discomfort. °SEEK IMMEDIATE MEDICAL CARE IF:  °· You lose control of your bowel or bladder (incontinence). °· You have increasing weakness in the lower back, pelvis, buttocks, or legs. °· You have redness or swelling of your back. °· You have a burning sensation when you urinate. °· You have pain that gets worse when you lie down or awakens you at night. °· Your pain is worse than you have experienced in the past. °· Your pain is lasting longer than 4 weeks. °· You are suddenly losing weight without reason. °MAKE SURE YOU: °· Understand these instructions. °· Will watch your condition. °· Will get help right away if you are not doing   well or get worse. °  °This information is not intended to replace advice given to you by your health care provider. Make sure you discuss any questions you have with your health care provider. °  °Document Released: 08/14/2001 Document Revised: 05/11/2015 Document Reviewed: 12/30/2011 °Elsevier Interactive Patient Education ©2016 Elsevier Inc. ° °

## 2015-08-10 NOTE — ED Provider Notes (Signed)
[facility] Emergency Department Provider Note  ____________________________________________  Time seen: Approximately 8:21 PM  I have reviewed the triage vital signs and the nursing notes.   HISTORY  Chief Complaint Back Pain and Sciatica   HPI Michaela NOURSE is a 24 y.o. female presents for evaluation of sciatica and low back pain. Patient reports multiple ER visits not getting any better. Has not seeked orthopedic follow-up as scheduled.Patient reports her insurance will start in January. 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 Denies any other recent trauma or injury to her back.   Past Medical History  Diagnosis Date  . Sciatica     There are no active problems to display for this patient.   Past Surgical History  Procedure Laterality Date  . Cholecystectomy    . Abdominal surgery      c section    Current Outpatient Rx  Name  Route  Sig  Dispense  Refill  . HYDROcodone-acetaminophen (NORCO) 5-325 MG tablet   Oral   Take 1-2 tablets by mouth every 4 (four) hours as needed for moderate pain.   15 tablet   0   . methocarbamol (ROBAXIN) 750 MG tablet   Oral   Take 1 tablet (750 mg total) by mouth 4 (four) times daily.   40 tablet   0   . naproxen (NAPROSYN) 500 MG tablet   Oral   Take 1 tablet (500 mg total) by mouth 2 (two) times daily with a meal.   60 tablet   0   . predniSONE (DELTASONE) 10 MG tablet   Oral   Take 5 tablets (50 mg total) by mouth daily with breakfast.   25 tablet   0     Allergies Penicillins and Morphine and related  No family history on file.  Social History Social History  Substance Use Topics  . Smoking status: Never Smoker   . Smokeless tobacco: None  . Alcohol Use: Yes     Comment: occassional    Review of Systems Constitutional: No fever/chills Eyes: No visual changes. ENT: No sore throat. Cardiovascular: Denies chest pain. Respiratory: Denies shortness of  breath. Gastrointestinal: No abdominal pain.  No nausea, no vomiting.  No diarrhea.  No constipation. Genitourinary: Negative for dysuria. Musculoskeletal: Positive for low back pain. Skin: Negative for rash. Neurological: Negative for headaches, focal weakness or numbness.  10-point ROS otherwise negative.  ____________________________________________   PHYSICAL EXAM:  VITAL SIGNS: ED Triage Vitals  Enc Vitals Group     BP 08/10/15 1958 153/84 mmHg     Pulse Rate 08/10/15 1958 77     Resp 08/10/15 1958 18     Temp 08/10/15 1958 98.2 F (36.8 C)     Temp src --      SpO2 08/10/15 1958 100 %     Weight 08/10/15 1958 215 lb (97.523 kg)     Height 08/10/15 1958  (1.626 m)     Head Cir --      Peak Flow --      Pain Score 08/10/15 1959 7     Pain Loc --      Pain Edu? --      Excl. in GC? --     Constitutional: Alert and oriented. Well appearing and in no acute distress. Cardiovascular: Normal rate, regular rhythm. Grossly normal heart sounds.  Good peripheral circulation. Respiratory: Normal respiratory effort.  No retractions. Lungs CTAB. Musculoskeletal: Straight leg raise positive  on the left greater than right approximately 40. Distally neurovascularly intact. Point tenderness at the lower lumbar paraspinal area. Neurologic:  Normal speech and language. No gross focal neurologic deficits are appreciated. No gait instability. Skin:  Skin is warm, dry and intact. No rash noted. Psychiatric: Mood and affect are normal. Speech and behavior are normal.  ____________________________________________   LABS (all labs ordered are listed, but only abnormal results are displayed)  Labs Reviewed - No data to display ____________________________________________    RADIOLOGY  Deferred at this visit. Lumbar myelogram from 2013:  IMPRESSION:  1. 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 2. Mild rightward disc  bulge at L5-S1 without significant stenosis. 3. Straightening of the normal lumbar lordosis. 4. Sub centimeter retroperitoneal lymph nodes as described. These are likely reactive. ____________________________________________   PROCEDURES  Procedure(s) performed: None  Critical Care performed: No  ____________________________________________   INITIAL IMPRESSION / ASSESSMENT AND PLAN / ED COURSE  Pertinent labs & imaging results that were available during my care of the patient were reviewed by me and considered in my medical decision making (see chart for details).  Encouraged orthopedic follow-up or referral to neurosurgery. Rx given for prednisone 10 mg 5 tablets daily 5 days, Robaxin-750 4 times a day, Naprosyn 500 mg twice a day. Patient follow-up with PCP for referral needed. ____________________________________________   FINAL CLINICAL IMPRESSION(S) / ED DIAGNOSES  Final diagnoses:  Sciatica associated with disorder of lumbar spine, left      Evangeline Dakinharles M Beers, PA-C 08/10/15 2109  Sharyn CreamerMark Quale, MD 08/10/15 2355

## 2015-08-31 ENCOUNTER — Emergency Department: Payer: Self-pay

## 2015-08-31 ENCOUNTER — Emergency Department
Admission: EM | Admit: 2015-08-31 | Discharge: 2015-08-31 | Disposition: A | Discharge status: Home / Self Care | Payer: Self-pay | Attending: Emergency Medicine | Admitting: Emergency Medicine

## 2015-08-31 ENCOUNTER — Encounter: Payer: Self-pay | Admitting: Emergency Medicine

## 2015-08-31 DIAGNOSIS — Z791 Long term (current) use of non-steroidal anti-inflammatories (NSAID): Secondary | ICD-10-CM | POA: Insufficient documentation

## 2015-08-31 DIAGNOSIS — R1084 Generalized abdominal pain: Secondary | ICD-10-CM | POA: Insufficient documentation

## 2015-08-31 DIAGNOSIS — Z7952 Long term (current) use of systemic steroids: Secondary | ICD-10-CM | POA: Insufficient documentation

## 2015-08-31 DIAGNOSIS — Z3202 Encounter for pregnancy test, result negative: Secondary | ICD-10-CM | POA: Insufficient documentation

## 2015-08-31 DIAGNOSIS — Z79899 Other long term (current) drug therapy: Secondary | ICD-10-CM | POA: Insufficient documentation

## 2015-08-31 DIAGNOSIS — R197 Diarrhea, unspecified: Secondary | ICD-10-CM | POA: Insufficient documentation

## 2015-08-31 DIAGNOSIS — R1110 Vomiting, unspecified: Secondary | ICD-10-CM | POA: Insufficient documentation

## 2015-08-31 DIAGNOSIS — M549 Dorsalgia, unspecified: Secondary | ICD-10-CM | POA: Insufficient documentation

## 2015-08-31 DIAGNOSIS — Z880 Allergy status to penicillin: Secondary | ICD-10-CM | POA: Insufficient documentation

## 2015-08-31 LAB — COMPREHENSIVE METABOLIC PANEL
ALBUMIN: 4.8 g/dL (ref 3.5–5.0)
ALT: 12 U/L — AB (ref 14–54)
AST: 15 U/L (ref 15–41)
Alkaline Phosphatase: 58 U/L (ref 38–126)
Anion gap: 8 (ref 5–15)
BUN: 13 mg/dL (ref 6–20)
CHLORIDE: 105 mmol/L (ref 101–111)
CO2: 28 mmol/L (ref 22–32)
Calcium: 9.6 mg/dL (ref 8.9–10.3)
Creatinine, Ser: 0.74 mg/dL (ref 0.44–1.00)
GFR calc Af Amer: >60 mL/min (ref >60)
GFR calc non Af Amer: >60 mL/min (ref >60)
GLUCOSE: 111 mg/dL — AB (ref 65–99)
POTASSIUM: 3.7 mmol/L (ref 3.5–5.1)
Sodium: 141 mmol/L (ref 135–145)
Total Bilirubin: 0.9 mg/dL (ref 0.3–1.2)
Total Protein: 8 g/dL (ref 6.5–8.1)

## 2015-08-31 LAB — CBC WITH DIFFERENTIAL/PLATELET
BASOS PCT: 0 %
Basophils Absolute: 0.1 10*3/uL (ref 0–0.1)
Eosinophils Absolute: 0.1 10*3/uL (ref 0–0.7)
Eosinophils Relative: 1 %
HEMATOCRIT: 39.8 % (ref 35.0–47.0)
Hemoglobin: 13.2 g/dL (ref 12.0–16.0)
Lymphocytes Relative: 15 %
Lymphs Abs: 2.7 10*3/uL (ref 1.0–3.6)
MCH: 26.8 pg (ref 26.0–34.0)
MCHC: 33.1 g/dL (ref 32.0–36.0)
MCV: 80.9 fL (ref 80.0–100.0)
MONO ABS: 1.1 10*3/uL — AB (ref 0.2–0.9)
MONOS PCT: 6 %
NEUTROS ABS: 13.7 10*3/uL — AB (ref 1.4–6.5)
Neutrophils Relative %: 78 %
Platelets: 309 10*3/uL (ref 150–440)
RBC: 4.92 MIL/uL (ref 3.80–5.20)
RDW: 15.3 % — AB (ref 11.5–14.5)
WBC: 17.7 10*3/uL — ABNORMAL HIGH (ref 3.6–11.0)

## 2015-08-31 LAB — PREGNANCY, URINE: Preg Test, Ur: NEGATIVE

## 2015-08-31 LAB — URINALYSIS COMPLETE WITH MICROSCOPIC (ARMC ONLY)
BACTERIA UA: NONE SEEN
Bilirubin Urine: NEGATIVE
GLUCOSE, UA: NEGATIVE mg/dL
Ketones, ur: NEGATIVE mg/dL
LEUKOCYTES UA: NEGATIVE
NITRITE: NEGATIVE
PH: 5 (ref 5.0–8.0)
Protein, ur: 30 mg/dL — AB
SPECIFIC GRAVITY, URINE: 1.023 (ref 1.005–1.030)

## 2015-08-31 LAB — LIPASE, BLOOD: LIPASE: 24 U/L (ref 11–51)

## 2015-08-31 MED ORDER — PROMETHAZINE HCL 25 MG/ML IJ SOLN
25.0000 mg | Freq: Once | INTRAMUSCULAR | Status: AC
  Administered 2015-08-31: 25 mg via INTRAMUSCULAR

## 2015-08-31 MED ORDER — ONDANSETRON HCL 4 MG/2ML IJ SOLN
INTRAMUSCULAR | Status: AC
  Filled 2015-08-31: qty 2

## 2015-08-31 MED ORDER — LORAZEPAM 2 MG/ML IJ SOLN
1.0000 mg | Freq: Once | INTRAMUSCULAR | Status: AC
Start: 2015-08-31 — End: 2015-08-31
  Administered 2015-08-31: 1 mg via INTRAVENOUS
  Filled 2015-08-31: qty 1

## 2015-08-31 MED ORDER — PROMETHAZINE HCL 25 MG/ML IJ SOLN
INTRAMUSCULAR | Status: AC
  Administered 2015-08-31: 25 mg via INTRAMUSCULAR
  Filled 2015-08-31: qty 1

## 2015-08-31 MED ORDER — FENTANYL CITRATE (PF) 100 MCG/2ML IJ SOLN
100.0000 ug | Freq: Once | INTRAMUSCULAR | Status: AC
  Administered 2015-08-31: 100 ug via INTRAVENOUS
  Filled 2015-08-31: qty 2

## 2015-08-31 MED ORDER — IBUPROFEN 600 MG PO TABS
600.0000 mg | ORAL_TABLET | Freq: Once | ORAL | Status: AC
  Administered 2015-08-31: 600 mg via ORAL
  Filled 2015-08-31: qty 1

## 2015-08-31 MED ORDER — MORPHINE SULFATE (PF) 4 MG/ML IV SOLN
INTRAVENOUS | Status: AC
  Filled 2015-08-31: qty 1

## 2015-08-31 MED ORDER — IOHEXOL 350 MG/ML SOLN
100.0000 mL | Freq: Once | INTRAVENOUS | Status: AC | PRN
  Administered 2015-08-31: 100 mL via INTRAVENOUS

## 2015-08-31 MED ORDER — ONDANSETRON HCL 4 MG/2ML IJ SOLN
4.0000 mg | Freq: Once | INTRAMUSCULAR | Status: AC
  Administered 2015-08-31: 4 mg via INTRAVENOUS
  Filled 2015-08-31: qty 2

## 2015-08-31 MED ORDER — IOHEXOL 240 MG/ML SOLN
25.0000 mL | Freq: Once | INTRAMUSCULAR | Status: AC | PRN
  Administered 2015-08-31: 25 mL via ORAL

## 2015-08-31 MED ORDER — SODIUM CHLORIDE 0.9 % IV BOLUS (SEPSIS)
1000.0000 mL | Freq: Once | INTRAVENOUS | Status: AC
  Administered 2015-08-31: 1000 mL via INTRAVENOUS

## 2015-08-31 MED ORDER — OXYCODONE-ACETAMINOPHEN 5-325 MG PO TABS
1.0000 | ORAL_TABLET | Freq: Four times a day (QID) | ORAL | Status: AC | PRN
Start: 2015-08-31 — End: ?

## 2015-08-31 MED ORDER — FENTANYL CITRATE (PF) 100 MCG/2ML IJ SOLN
INTRAMUSCULAR | Status: AC
  Filled 2015-08-31: qty 2

## 2015-08-31 MED ORDER — ONDANSETRON HCL 4 MG/2ML IJ SOLN
4.0000 mg | Freq: Once | INTRAMUSCULAR | Status: AC
  Administered 2015-08-31: 4 mg via INTRAVENOUS

## 2015-08-31 MED ORDER — PROMETHAZINE HCL 12.5 MG PO TABS: 12.5000 mg | ORAL_TABLET | Freq: Four times a day (QID) | ORAL | Status: AC | PRN

## 2015-08-31 MED ORDER — FENTANYL CITRATE (PF) 100 MCG/2ML IJ SOLN
50.0000 ug | Freq: Once | INTRAMUSCULAR | Status: AC
  Administered 2015-08-31: 50 ug via INTRAVENOUS

## 2015-08-31 NOTE — ED Provider Notes (Signed)
[facility] Emergency Department Provider Note  ____________________________________________  Time seen: Approximately 401 AM  I have reviewed the triage vital signs and the nursing notes.   HISTORY  Chief Complaint Abdominal Pain    HPI Michaela Knapp is a 24 y.o. female who comes into the hospital stay with abdominal cramping and vomiting. The patient reports that the symptoms occurred around 9 PM. She reports that she is also started having some diarrhea as well. She reports that she's vomited too numerous to count times and she's had diarrhea 3 times. She reports that her emesis is yellow. The patient reports that she also has pain all over her abdomen and into her back. 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 The patient rates her pain a 9 out of 10 in intensity and its crampy pain in her mid back and sides. The patient was concerned about the symptoms that she decided to come in for evaluation.She's had no chest pain no blurry vision or headache or shortness of breath no leg cramps.   Past Medical History  Diagnosis Date  . Sciatica     There are no active problems to display for this patient.   Past Surgical History  Procedure Laterality Date  . Cholecystectomy    . Abdominal surgery      c section    Current Outpatient Rx  Name  Route  Sig  Dispense  Refill  . HYDROcodone-acetaminophen (NORCO) 5-325 MG tablet   Oral   Take 1-2 tablets by mouth every 4 (four) hours as needed for moderate pain.   15 tablet   0   . methocarbamol (ROBAXIN) 750 MG tablet   Oral   Take 1 tablet (750 mg total) by mouth 4 (four) times daily.   40 tablet   0   . naproxen (NAPROSYN) 500 MG tablet   Oral   Take 1 tablet (500 mg total) by mouth 2 (two) times daily with a meal.   60 tablet   0   . predniSONE (DELTASONE) 10 MG tablet   Oral   Take 5 tablets (50 mg total) by mouth daily with breakfast.   25 tablet   0      Allergies Penicillins and Morphine and related  History reviewed. No pertinent family history.  Social History Social History  Substance Use Topics  . Smoking status: Never Smoker   . Smokeless tobacco: None  . Alcohol Use: Yes     Comment: occassional    Review of Systems Constitutional: No fever/chills Eyes: No visual changes. ENT: No sore throat. Cardiovascular: Denies chest pain. Respiratory: Denies shortness of breath. Gastrointestinal: Abdominal pain, vomiting, diarrhea Genitourinary: Negative for dysuria. Musculoskeletal:  back pain. Skin: Negative for rash. Neurological: Negative for headaches, focal weakness or numbness.  10-point ROS otherwise negative.  ____________________________________________   PHYSICAL EXAM:  VITAL SIGNS: ED Triage Vitals  Enc Vitals Group     BP 08/31/15 0129 130/84 mmHg     Pulse Rate 08/31/15 0129 105     Resp 08/31/15 0129 18     Temp 08/31/15 0129 98.5 F (36.9 C)     Temp Source 08/31/15 0129 Oral     SpO2 08/31/15 0129 97 %     Weight 08/31/15 0129 215 lb (97.523 kg)     Height 08/31/15 0129  (1.626 m)     Head Cir --      Peak Flow --  Pain Score 08/31/15 0129 9     Pain Loc --      Pain Edu? --      Excl. in GC? --     Constitutional: Alert and oriented. Well appearing and in moderate distress. Eyes: Conjunctivae are normal. PERRL. EOMI. Head: Atraumatic. Nose: No congestion/rhinnorhea. Mouth/Throat: Mucous membranes are moist.  Oropharynx non-erythematous. Neck: No stridor.   Cardiovascular: Normal rate, regular rhythm. Grossly normal heart sounds.  Good peripheral circulation. Respiratory: Normal respiratory effort.  No retractions. Lungs CTAB. Gastrointestinal: Soft and diffusely tender. No distention. Positive bowel sounds. Musculoskeletal: No lower extremity tenderness nor edema.   Neurologic:  Normal speech and language.  Skin:  Skin is warm, dry and intact.  Psychiatric: Mood and affect  are normal.   ____________________________________________   LABS (all labs ordered are listed, but only abnormal results are displayed)  Labs Reviewed  CBC WITH DIFFERENTIAL/PLATELET - Abnormal; Notable for the following:    WBC 17.7 (*)    RDW 15.3 (*)    Neutro Abs 13.7 (*)    Monocytes Absolute 1.1 (*)    All other components within normal limits  COMPREHENSIVE METABOLIC PANEL - Abnormal; Notable for the following:    Glucose, Bld 111 (*)    ALT 12 (*)    All other components within normal limits  URINALYSIS COMPLETEWITH MICROSCOPIC ([facility] ONLY) - Abnormal; Notable for the following:    Color, Urine YELLOW (*)    APPearance CLOUDY (*)    Hgb urine dipstick 1+ (*)    Protein, ur 30 (*)    Squamous Epithelial / LPF 0-5 (*)    All other components within normal limits  LIPASE, BLOOD  PREGNANCY, URINE   ____________________________________________  EKG  none ____________________________________________  RADIOLOGY  CT abd pain: pending ____________________________________________   PROCEDURES  Procedure(s) performed: None  Critical Care performed: No  ____________________________________________   INITIAL IMPRESSION / ASSESSMENT AND PLAN / ED COURSE  Pertinent labs & imaging results that were available during my care of the patient were reviewed by me and considered in my medical decision making (see chart for details).  This is a 24 year old female who comes into the hospital today with some abdominal pain vomiting and diarrhea. She will receive a dose of Zofran as well as a liter of normal saline. I'll reassess the patient when she's received the medication to determine if her abdominal pain is worsening or getting better.  ----------------------------------------- 7:14 AM on 08/31/2015 -----------------------------------------  After the by mouth trial the patient was having some worsened pain and nausea. Given the patient's increased pain as well as her  white count I will do a CT scan to have her pain evaluated. The care will be signed out to Dr. Lenard LancePaduchowski. Who will follow-up the results of the patient's CT and disposition the patient. ____________________________________________   FINAL CLINICAL IMPRESSION(S) / ED DIAGNOSES  Final diagnoses:  Generalized abdominal pain  Vomiting and diarrhea      Rebecka ApleyAllison P Webster, MD 08/31/15 [id number]

## 2015-08-31 NOTE — ED Notes (Signed)
This tech taking pt to waiting room by wheelchair for pickup

## 2015-08-31 NOTE — ED Notes (Signed)
RN in room to wheel pt out. Having some vomiting and reports still feels bad. Slightly yellow vomit on floor. Will notify MD.

## 2015-08-31 NOTE — ED Notes (Signed)
NAD. Pt alert and oriented.  Reinforced reasons to return, diet for next 24-48 hrs, and medication; she verbalized understanding. Signed paper dc papers bc epad not working at this time.

## 2015-08-31 NOTE — ED Notes (Signed)
Pt reports nausea better and feels ready to go. Dr Lenard Lancepaduchowski notified and reports ok for pt to be discharged

## 2015-08-31 NOTE — ED Notes (Signed)
Pt in with epigastric pain that started tonight along with n.v.d.

## 2015-08-31 NOTE — Discharge Instructions (Signed)
Abdominal Pain, Adult °Many things can cause abdominal pain. Usually, abdominal pain is not caused by a disease and will improve without treatment. It can often be observed and treated at home. Your health care provider will do a physical exam and possibly order blood tests and X-rays to help determine the seriousness of your pain. However, in many cases, more time must pass before a clear cause of the pain can be found. Before that point, your health care provider may not know if you need more testing or further treatment. °HOME CARE INSTRUCTIONS °Monitor your abdominal pain for any changes. The following actions may help to alleviate any discomfort you are experiencing: °· Only take over-the-counter or prescription medicines as directed by your health care provider. °· Do not take laxatives unless directed to do so by your health care provider. °· Try a clear liquid diet (broth, tea, or water) as directed by your health care provider. Slowly move to a bland diet as tolerated. °SEEK MEDICAL CARE IF: °· You have unexplained abdominal pain. °· You have abdominal pain associated with nausea or diarrhea. °· You have pain when you urinate or have a bowel movement. °· You experience abdominal pain that wakes you in the night. °· You have abdominal pain that is worsened or improved by eating food. °· You have abdominal pain that is worsened with eating fatty foods. °· You have a fever. °SEEK IMMEDIATE MEDICAL CARE IF: °· Your pain does not go away within 2 hours. °· You keep throwing up (vomiting). °· Your pain is felt only in portions of the abdomen, such as the right side or the left lower portion of the abdomen. °· You pass bloody or black tarry stools. °MAKE SURE YOU: °· Understand these instructions. °· Will watch your condition. °· Will get help right away if you are not doing well or get worse. °  °This information is not intended to replace advice given to you by your health care provider. Make sure you discuss  any questions you have with your health care provider. °  °Document Released: 05/30/2005 Document Revised: 05/11/2015 Document Reviewed: 04/29/2013 °Elsevier Interactive Patient Education ©2016 Else[employer] Inc. ° °Nausea and Vomiting °Nausea means you feel sick to your stomach. Throwing up (vomiting) is a reflex where stomach contents come out of your mouth. °HOME CARE  °· Take medicine as told by your doctor. °· Do not force yourself to eat. However, you do need to drink fluids. °· If you feel like eating, eat a normal diet as told by your doctor. °¨ Eat rice, wheat, potatoes, bread, lean meats, yogurt, fruits, and vegetables. °¨ Avoid high-fat foods. °· Drink enough fluids to keep your pee (urine) clear or pale yellow. °· Ask your doctor how to replace body fluid losses (rehydrate). Signs of body fluid loss (dehydration) include: °¨ Feeling very thirsty. °¨ Dry lips and mouth. °¨ Feeling dizzy. °¨ Dark pee. °¨ Peeing less than normal. °¨ Feeling confused. °¨ Fast breathing or heart rate. °GET HELP RIGHT AWAY IF:  °· You have blood in your throw up. °· You have black or bloody poop (stool). °· You have a bad headache or stiff neck. °· You feel confused. °· You have bad belly (abdominal) pain. °· You have chest pain or trouble breathing. °· You do not pee at least once every 8 hours. °· You have cold, clammy skin. °· You keep throwing up after 24 to 48 hours. °· You have a fever. °MAKE SURE YOU:  °·   Understand these instructions. °· Will watch your condition. °· Will get help right away if you are not doing well or get worse. °  °This information is not intended to replace advice given to you by your health care provider. Make sure you discuss any questions you have with your health care provider. °  °Document Released: 02/06/2008 Document Revised: 11/12/2011 Document Reviewed: 01/19/2011 °Elsevier Interactive Patient Education ©2016 [employer] Inc. ° °

## 2015-08-31 NOTE — ED Notes (Signed)
Pt called ride and they are on way since pt unable to drive r/t medications.

## 2015-08-31 NOTE — ED Notes (Addendum)
Pt reports still vomiting and c/o abdominal pain. Clear spit in vomit bag noted. Will notify dr Lenard Lancepaduchowski

## 2015-08-31 NOTE — ED Provider Notes (Signed)
-----------------------------------------   9:29 AM on 08/31/2015 -----------------------------------------  Patient CT scan shows normal results. We will discharge patient home with a short course of pain and nausea medication have her follow-up with her primary care physician in one to 2 days for recheck blood sugar evaluation. I discussed strict abdominal pain return precautions with the patient which she is agreeable.  Minna AntisKevin Jamielee Mchale, MD 08/31/15 0930

## 2015-08-31 NOTE — ED Notes (Signed)
Returned from CT.

## 2015-10-18 ENCOUNTER — Encounter: Payer: Self-pay | Admitting: Emergency Medicine

## 2015-10-18 ENCOUNTER — Emergency Department
Admission: EM | Admit: 2015-10-18 | Discharge: 2015-10-18 | Disposition: A | Discharge status: Home / Self Care | Payer: Self-pay | Attending: Emergency Medicine | Admitting: Emergency Medicine

## 2015-10-18 DIAGNOSIS — Z79899 Other long term (current) drug therapy: Secondary | ICD-10-CM | POA: Insufficient documentation

## 2015-10-18 DIAGNOSIS — Z3202 Encounter for pregnancy test, result negative: Secondary | ICD-10-CM | POA: Insufficient documentation

## 2015-10-18 DIAGNOSIS — A499 Bacterial infection, unspecified: Secondary | ICD-10-CM

## 2015-10-18 DIAGNOSIS — Z7952 Long term (current) use of systemic steroids: Secondary | ICD-10-CM | POA: Insufficient documentation

## 2015-10-18 DIAGNOSIS — Z880 Allergy status to penicillin: Secondary | ICD-10-CM | POA: Insufficient documentation

## 2015-10-18 DIAGNOSIS — J029 Acute pharyngitis, unspecified: Secondary | ICD-10-CM | POA: Insufficient documentation

## 2015-10-18 DIAGNOSIS — J040 Acute laryngitis: Secondary | ICD-10-CM | POA: Insufficient documentation

## 2015-10-18 DIAGNOSIS — N390 Urinary tract infection, site not specified: Secondary | ICD-10-CM | POA: Insufficient documentation

## 2015-10-18 DIAGNOSIS — Z791 Long term (current) use of non-steroidal anti-inflammatories (NSAID): Secondary | ICD-10-CM | POA: Insufficient documentation

## 2015-10-18 LAB — URINALYSIS COMPLETE WITH MICROSCOPIC (ARMC ONLY)
BILIRUBIN URINE: NEGATIVE
Glucose, UA: NEGATIVE mg/dL
KETONES UR: NEGATIVE mg/dL
NITRITE: POSITIVE — AB
PH: 6 (ref 5.0–8.0)
PROTEIN: 30 mg/dL — AB
SPECIFIC GRAVITY, URINE: 1.018 (ref 1.005–1.030)

## 2015-10-18 LAB — POCT PREGNANCY, URINE: Preg Test, Ur: NEGATIVE

## 2015-10-18 LAB — POCT RAPID STREP A: Streptococcus, Group A Screen (Direct): NEGATIVE

## 2015-10-18 MED ORDER — PSEUDOEPH-BROMPHEN-DM 30-2-10 MG/5ML PO SYRP: 5.0000 mL | ORAL_SOLUTION | Freq: Four times a day (QID) | ORAL | Status: AC | PRN

## 2015-10-18 MED ORDER — LIDOCAINE VISCOUS 2 % MT SOLN: 5.0000 mL | Freq: Four times a day (QID) | OROMUCOSAL | Status: AC | PRN

## 2015-10-18 MED ORDER — HYDROCOD POLST-CPM POLST ER 10-8 MG/5ML PO SUER
5.0000 mL | Freq: Once | ORAL | Status: AC
  Administered 2015-10-18: 5 mL via ORAL
  Filled 2015-10-18: qty 5

## 2015-10-18 MED ORDER — PHENAZOPYRIDINE HCL 200 MG PO TABS: 200.0000 mg | ORAL_TABLET | Freq: Three times a day (TID) | ORAL | Status: AC | PRN

## 2015-10-18 MED ORDER — SULFAMETHOXAZOLE-TRIMETHOPRIM 800-160 MG PO TABS: 1.0000 | ORAL_TABLET | Freq: Two times a day (BID) | ORAL | Status: AC

## 2015-10-18 MED ORDER — KETOROLAC TROMETHAMINE 60 MG/2ML IM SOLN
60.0000 mg | Freq: Once | INTRAMUSCULAR | Status: AC
  Administered 2015-10-18: 60 mg via INTRAMUSCULAR
  Filled 2015-10-18: qty 2

## 2015-10-18 MED ORDER — LIDOCAINE VISCOUS 2 % MT SOLN
15.0000 mL | Freq: Once | OROMUCOSAL | Status: AC
  Administered 2015-10-18: 15 mL via OROMUCOSAL
  Filled 2015-10-18: qty 15

## 2015-10-18 NOTE — ED Provider Notes (Signed)
[facility] Emergency Department Provider Note  ____________________________________________  Time seen: Approximately 1:46 PM  I have reviewed the triage vital signs and the nursing notes.   HISTORY  Chief Complaint Sore Throat    HPI Michaela Knapp is a 25 y.o. female patient complaining of sore throat decreased force volume and a productive greenish cough for 2-3 days. Patient also complaining of left flank pain for 3 days. Patient state in a frequency and dysuria. Patient denies any vaginal discharge. Patient denies any fever associated this complaint. Patient rates her pain discomfort as a 10 over 10. No palliative measures taken for this complaint.   Past Medical History  Diagnosis Date  . Sciatica     There are no active problems to display for this patient.   Past Surgical History  Procedure Laterality Date  . Cholecystectomy    . Abdominal surgery      c section    Current Outpatient Rx  Name  Route  Sig  Dispense  Refill  . brompheniramine-pseudoephedrine-DM 30-2-10 MG/5ML syrup   Oral   Take 5 mLs by mouth 4 (four) times daily as needed. mix with 5 mL of viscous lidocaine for swish and swallow.   120 mL   0   . HYDROcodone-acetaminophen (NORCO) 5-325 MG tablet   Oral   Take 1-2 tablets by mouth every 4 (four) hours as needed for moderate pain.   15 tablet   0   . lidocaine (XYLOCAINE) 2 % solution   Mouth/Throat   Use as directed 5 mLs in the mouth or throat every 6 (six) hours as needed for mouth pain. Mixed with 5 mL of Bromfed-DM for swish and swallow.   100 mL   0   . methocarbamol (ROBAXIN) 750 MG tablet   Oral   Take 1 tablet (750 mg total) by mouth 4 (four) times daily.   40 tablet   0   . naproxen (NAPROSYN) 500 MG tablet   Oral   Take 1 tablet (500 mg total) by mouth 2 (two) times daily with a meal.   60 tablet   0   . oxyCODONE-acetaminophen (ROXICET) 5-325 MG tablet   Oral   Take 1 tablet by mouth every 6  (six) hours as needed.   12 tablet   0   . phenazopyridine (PYRIDIUM) 200 MG tablet   Oral   Take 1 tablet (200 mg total) by mouth 3 (three) times daily as needed for pain.   6 tablet   0   . predniSONE (DELTASONE) 10 MG tablet   Oral   Take 5 tablets (50 mg total) by mouth daily with breakfast.   25 tablet   0   . promethazine (PHENERGAN) 12.5 MG tablet   Oral   Take 1 tablet (12.5 mg total) by mouth every 6 (six) hours as needed for nausea or vomiting.   15 tablet   0   . sulfamethoxazole-trimethoprim (BACTRIM DS,SEPTRA DS) 800-160 MG tablet   Oral   Take 1 tablet by mouth 2 (two) times daily.   20 tablet   0     Allergies Penicillins and Morphine and related  History reviewed. No pertinent family history.  Social History Social History  Substance Use Topics  . Smoking status: Never Smoker   . Smokeless tobacco: None  . Alcohol Use: Yes     Comment: occassional    Review of Systems Constitutional: No fever/chills Eyes: No visual changes. ENT: Sore throat and  decreased voice volume.  Cardiovascular: Denies chest pain. Respiratory: Denies shortness of breath. Productive greenish cough Gastrointestinal: No abdominal pain.  No nausea, no vomiting.  No diarrhea.  No constipation. Genitourinary: Positive for dysuria. Musculoskeletal: Left flank pain. Skin: Negative for rash. Neurological: Negative for headaches, focal weakness or numbness.    ____________________________________________   PHYSICAL EXAM:  VITAL SIGNS: ED Triage Vitals  Enc Vitals Group     BP 10/18/15 1311 132/98 mmHg     Pulse Rate 10/18/15 1311 114     Resp 10/18/15 1311 20     Temp 10/18/15 1311 98.5 F (36.9 C)     Temp Source 10/18/15 1311 Oral     SpO2 10/18/15 1311 97 %     Weight 10/18/15 1311 206 lb (93.441 kg)     Height 10/18/15 1311  (1.626 m)     Head Cir --      Peak Flow --      Pain Score 10/18/15 1311 10     Pain Loc --      Pain Edu? --      Excl. in  GC? --     Constitutional: Alert and oriented. Well appearing and in no acute distress. Eyes: Conjunctivae are normal. PERRL. EOMI. Head: Atraumatic. Nose: No congestion/rhinnorhea. Mouth/Throat: Mucous membranes are moist.  Oropharynx erythematous without exudative tonsils. Neck: No stridor.  No cervical spine tenderness to palpation. Hematological/Lymphatic/Immunilogical: No cervical lymphadenopathy. Cardiovascular: Normal rate, regular rhythm. Grossly normal heart sounds.  Good peripheral circulation. Respiratory: Normal respiratory effort.  No retractions. Lungs CTAB. Gastrointestinal: Soft and nontender. No distention. No abdominal bruits. No CVA tenderness. Musculoskeletal: No lower extremity tenderness nor edema.  No joint effusions. Neurologic:  Normal speech and language. No gross focal neurologic deficits are appreciated. No gait instability. Skin:  Skin is warm, dry and intact. No rash noted. Psychiatric: Mood and affect are normal. Speech and behavior are normal.  ____________________________________________   LABS (all labs ordered are listed, but only abnormal results are displayed)  Labs Reviewed  URINALYSIS COMPLETEWITH MICROSCOPIC ([facility] ONLY) - Abnormal; Notable for the following:    Color, Urine YELLOW (*)    APPearance CLOUDY (*)    Hgb urine dipstick 1+ (*)    Protein, ur 30 (*)    Nitrite POSITIVE (*)    Leukocytes, UA 1+ (*)    Bacteria, UA MANY (*)    Squamous Epithelial / LPF TOO NUMEROUS TO COUNT (*)    All other components within normal limits  CULTURE, GROUP A STREP ([facility])  POC URINE PREG, ED  POCT PREGNANCY, URINE  POCT RAPID STREP A   ____________________________________________  EKG   ____________________________________________  RADIOLOGY   ____________________________________________   PROCEDURES  Procedure(s) performed: None  Critical Care performed: No  ____________________________________________   INITIAL IMPRESSION /  ASSESSMENT AND PLAN / ED COURSE  Pertinent labs & imaging results that were available during my care of the patient were reviewed by me and considered in my medical decision making (see chart for details).  Urinary tract infection, laryngitis, pharyngitis, . Discussed the results with patient. ____________________________________________   FINAL CLINICAL IMPRESSION(S) / ED DIAGNOSES  Final diagnoses:  Urinary tract infection, bacterial  Viral pharyngitis  Laryngitis      Joni Reining, PA-C 10/20/15 2250  Jene Every, MD 10/21/15 [phone]

## 2015-10-18 NOTE — ED Notes (Signed)
Pt to ed with c/o sore throat and lower back pain x 3 days.

## 2015-10-22 LAB — CULTURE, GROUP A STREP (THRC)

## 2015-11-15 ENCOUNTER — Other Ambulatory Visit: Payer: Self-pay | Admitting: Internal Medicine

## 2015-11-15 DIAGNOSIS — G8929 Other chronic pain: Secondary | ICD-10-CM

## 2015-11-15 DIAGNOSIS — M5442 Lumbago with sciatica, left side: Principal | ICD-10-CM

## 2015-11-21 ENCOUNTER — Encounter: Payer: Self-pay | Admitting: *Deleted

## 2015-11-21 ENCOUNTER — Emergency Department: Payer: BLUE CROSS/BLUE SHIELD

## 2015-11-21 ENCOUNTER — Emergency Department
Admission: EM | Admit: 2015-11-21 | Discharge: 2015-11-21 | Disposition: A | Discharge status: Home / Self Care | Payer: BLUE CROSS/BLUE SHIELD | Attending: Emergency Medicine | Admitting: Emergency Medicine

## 2015-11-21 DIAGNOSIS — Z79899 Other long term (current) drug therapy: Secondary | ICD-10-CM | POA: Insufficient documentation

## 2015-11-21 DIAGNOSIS — Z7952 Long term (current) use of systemic steroids: Secondary | ICD-10-CM | POA: Insufficient documentation

## 2015-11-21 DIAGNOSIS — N10 Acute tubulo-interstitial nephritis: Secondary | ICD-10-CM | POA: Diagnosis not present

## 2015-11-21 DIAGNOSIS — R319 Hematuria, unspecified: Secondary | ICD-10-CM | POA: Diagnosis not present

## 2015-11-21 DIAGNOSIS — R1032 Left lower quadrant pain: Secondary | ICD-10-CM | POA: Diagnosis present

## 2015-11-21 LAB — CBC WITH DIFFERENTIAL/PLATELET
BASOS PCT: 1 %
Basophils Absolute: 0 10*3/uL (ref 0–0.1)
EOS ABS: 0.1 10*3/uL (ref 0–0.7)
Eosinophils Relative: 2 %
HCT: 39.5 % (ref 35.0–47.0)
HEMOGLOBIN: 13.2 g/dL (ref 12.0–16.0)
Lymphocytes Relative: 32 %
Lymphs Abs: 2.7 10*3/uL (ref 1.0–3.6)
MCH: 27 pg (ref 26.0–34.0)
MCHC: 33.4 g/dL (ref 32.0–36.0)
MCV: 80.8 fL (ref 80.0–100.0)
MONOS PCT: 6 %
Monocytes Absolute: 0.5 10*3/uL (ref 0.2–0.9)
NEUTROS PCT: 59 %
Neutro Abs: 5 10*3/uL (ref 1.4–6.5)
Platelets: 263 10*3/uL (ref 150–440)
RBC: 4.89 MIL/uL (ref 3.80–5.20)
RDW: 14.7 % — ABNORMAL HIGH (ref 11.5–14.5)
WBC: 8.4 10*3/uL (ref 3.6–11.0)

## 2015-11-21 LAB — URINALYSIS COMPLETE WITH MICROSCOPIC (ARMC ONLY)
Bilirubin Urine: NEGATIVE
GLUCOSE, UA: NEGATIVE mg/dL
KETONES UR: NEGATIVE mg/dL
Nitrite: POSITIVE — AB
PROTEIN: 30 mg/dL — AB
Specific Gravity, Urine: 1.027 (ref 1.005–1.030)
pH: 5 (ref 5.0–8.0)

## 2015-11-21 LAB — BASIC METABOLIC PANEL
ANION GAP: 4 — AB (ref 5–15)
BUN: 16 mg/dL (ref 6–20)
CHLORIDE: 107 mmol/L (ref 101–111)
CO2: 26 mmol/L (ref 22–32)
Calcium: 9.3 mg/dL (ref 8.9–10.3)
Creatinine, Ser: 0.78 mg/dL (ref 0.44–1.00)
GFR calc Af Amer: >60 mL/min (ref >60)
GLUCOSE: 98 mg/dL (ref 65–99)
POTASSIUM: 4.5 mmol/L (ref 3.5–5.1)
Sodium: 137 mmol/L (ref 135–145)

## 2015-11-21 MED ORDER — ONDANSETRON HCL 4 MG/2ML IJ SOLN
INTRAMUSCULAR | Status: AC
  Administered 2015-11-21: 4 mg via INTRAVENOUS
  Filled 2015-11-21: qty 2

## 2015-11-21 MED ORDER — KETOROLAC TROMETHAMINE 30 MG/ML IJ SOLN
30.0000 mg | Freq: Once | INTRAMUSCULAR | Status: AC
  Administered 2015-11-21: 30 mg via INTRAVENOUS

## 2015-11-21 MED ORDER — DEXTROSE 5 % IV SOLN
1.0000 g | Freq: Once | INTRAVENOUS | Status: AC
  Administered 2015-11-21: 1 g via INTRAVENOUS
  Filled 2015-11-21 (×2): qty 10

## 2015-11-21 MED ORDER — IBUPROFEN 400 MG PO TABS: 400.0000 mg | ORAL_TABLET | Freq: Four times a day (QID) | ORAL | Status: AC | PRN

## 2015-11-21 MED ORDER — ONDANSETRON HCL 4 MG/2ML IJ SOLN
4.0000 mg | Freq: Once | INTRAMUSCULAR | Status: AC
  Administered 2015-11-21: 4 mg via INTRAVENOUS

## 2015-11-21 MED ORDER — KETOROLAC TROMETHAMINE 30 MG/ML IJ SOLN
INTRAMUSCULAR | Status: AC
  Administered 2015-11-21: 30 mg via INTRAVENOUS
  Filled 2015-11-21: qty 1

## 2015-11-21 MED ORDER — NITROFURANTOIN MACROCRYSTAL 100 MG PO CAPS: 100.0000 mg | ORAL_CAPSULE | Freq: Four times a day (QID) | ORAL | Status: AC

## 2015-11-21 MED ORDER — ONDANSETRON HCL 4 MG PO TABS: 4.0000 mg | ORAL_TABLET | Freq: Three times a day (TID) | ORAL | Status: AC | PRN

## 2015-11-21 MED ORDER — SODIUM CHLORIDE 0.9 % IV BOLUS (SEPSIS)
500.0000 mL | Freq: Once | INTRAVENOUS | Status: AC
  Administered 2015-11-21: 500 mL via INTRAVENOUS

## 2015-11-21 NOTE — ED Notes (Signed)
Patient transported to Ultrasound 

## 2015-11-21 NOTE — ED Notes (Signed)
Patient to room 33 with complaint of dull 7/10 left flank pain.  She states the pain was sharp but now is dull.  Warm blanket provided.

## 2015-11-21 NOTE — ED Notes (Signed)
States left sided flank pain and blood in her urine, states 1 month ago she had a kidney infection, states she saw her PCP last Monday and was told she had a UTI but it is not getting any better, state she was given levaquin and has been taking it

## 2015-11-21 NOTE — ED Provider Notes (Signed)
Time Seen: Approximately 1405  I have reviewed the triage notes  Chief Complaint: Flank Pain and Hematuria   History of Present Illness: Michaela Knapp is a 25 y.o. female who presents with some left flank discomfort. Patient states she's been fighting recurrent urinary tract infections currently finishing up a course of Levaquin. She has not felt better and still feels burning and frequency of urination. Patient was seen here in the past and had a CAT scan which did not show any evidence of renal colic at that time. Scan was in December of last year. Patient states that she has been on multiple antibiotics in the past and actually she's been on Levaquin "" several times "". In this usually will resolve her urinary tract infections. She denies any fever she has had some nausea but no persistent vomiting. She denies any right-sided abdominal or flank discomfort. She's had a previous cholecystectomy and C-section.   Past Medical History  Diagnosis Date  . Sciatica     There are no active problems to display for this patient.   Past Surgical History  Procedure Laterality Date  . Cholecystectomy    . Abdominal surgery      c section    Past Surgical History  Procedure Laterality Date  . Cholecystectomy    . Abdominal surgery      c section    Current Outpatient Rx  Name  Route  Sig  Dispense  Refill  . brompheniramine-pseudoephedrine-DM 30-2-10 MG/5ML syrup   Oral   Take 5 mLs by mouth 4 (four) times daily as needed. mix with 5 mL of viscous lidocaine for swish and swallow.   120 mL   0   . HYDROcodone-acetaminophen (NORCO) 5-325 MG tablet   Oral   Take 1-2 tablets by mouth every 4 (four) hours as needed for moderate pain.   15 tablet   0   . ibuprofen (ADVIL,MOTRIN) 400 MG tablet   Oral   Take 1 tablet (400 mg total) by mouth every 6 (six) hours as needed.   30 tablet   0   . lidocaine (XYLOCAINE) 2 % solution   Mouth/Throat   Use as directed 5 mLs in the mouth  or throat every 6 (six) hours as needed for mouth pain. Mixed with 5 mL of Bromfed-DM for swish and swallow.   100 mL   0   . methocarbamol (ROBAXIN) 750 MG tablet   Oral   Take 1 tablet (750 mg total) by mouth 4 (four) times daily.   40 tablet   0   . naproxen (NAPROSYN) 500 MG tablet   Oral   Take 1 tablet (500 mg total) by mouth 2 (two) times daily with a meal.   60 tablet   0   . nitrofurantoin (MACRODANTIN) 100 MG capsule   Oral   Take 1 capsule (100 mg total) by mouth 4 (four) times daily.   28 capsule   0   . ondansetron (ZOFRAN) 4 MG tablet   Oral   Take 1 tablet (4 mg total) by mouth every 8 (eight) hours as needed for nausea or vomiting.   21 tablet   0   . oxyCODONE-acetaminophen (ROXICET) 5-325 MG tablet   Oral   Take 1 tablet by mouth every 6 (six) hours as needed.   12 tablet   0   . phenazopyridine (PYRIDIUM) 200 MG tablet   Oral   Take 1 tablet (200 mg total) by mouth 3 (three)  times daily as needed for pain.   6 tablet   0   . predniSONE (DELTASONE) 10 MG tablet   Oral   Take 5 tablets (50 mg total) by mouth daily with breakfast.   25 tablet   0   . promethazine (PHENERGAN) 12.5 MG tablet   Oral   Take 1 tablet (12.5 mg total) by mouth every 6 (six) hours as needed for nausea or vomiting.   15 tablet   0   . sulfamethoxazole-trimethoprim (BACTRIM DS,SEPTRA DS) 800-160 MG tablet   Oral   Take 1 tablet by mouth 2 (two) times daily.   20 tablet   0     Allergies:  Penicillins and Morphine and related  Family History: History reviewed. No pertinent family history.  Social History: Social History  Substance Use Topics  . Smoking status: Never Smoker   . Smokeless tobacco: None  . Alcohol Use: Yes     Comment: occassional     Review of Systems:   10 point review of systems was performed and was otherwise negative:  Constitutional: No fever Eyes: No visual disturbances ENT: No sore throat, ear pain Cardiac: No chest  pain Respiratory: No shortness of breath, wheezing, or stridor she has a mild increase in her pain with deep inspiration in the left flank area Abdomen: No abdominal pain, no vomiting, No diarrhea Endocrine: No weight loss, No night sweats Extremities: No peripheral edema, cyanosis Skin: No rashes, easy bruising Neurologic: No focal weakness, trouble with speech or swollowing Urologic: No dysuria, Hematuria, or urinary frequency   Physical Exam:  ED Triage Vitals  Enc Vitals Group     BP 11/21/15 1048 121/78 mmHg     Pulse Rate 11/21/15 1048 80     Resp 11/21/15 1048 16     Temp 11/21/15 1048 98.7 F (37.1 C)     Temp Source 11/21/15 1048 Oral     SpO2 11/21/15 1048 98 %     Weight 11/21/15 1048 205 lb (92.987 kg)     Height 11/21/15 1048 5\' 4"  (1.626 m)     Head Cir --      Peak Flow --      Pain Score 11/21/15 1048 7     Pain Loc --      Pain Edu? --      Excl. in GC? --     General: Awake , Alert , and Oriented times 3; GCS 15 Head: Normal cephalic , atraumatic Eyes: Pupils equal , round, reactive to light Nose/Throat: No nasal drainage, patent upper airway without erythema or exudate.  Neck: Supple, Full range of motion, No anterior adenopathy or palpable thyroid masses Lungs: Clear to ascultation without wheezes , rhonchi, or rales Heart: Regular rate, regular rhythm without murmurs , gallops , or rubs Abdomen: Patient has no obvious significant reproducible pain and describes a dull ache with palpation and also with palpation in the left flank area. Pain anteriorly is generally in the left lower quadrant. There is no rebound guarding or rigidity or any palpable masses. No focal tenderness over McBurney's point        Extremities: 2 plus symmetric pulses. No edema, clubbing or cyanosis Neurologic: normal ambulation, Motor symmetric without deficits, sensory intact Skin: warm, dry, no rashes   Labs:   All laboratory work was reviewed including any pertinent negatives  or positives listed below:  Labs Reviewed  URINALYSIS COMPLETEWITH MICROSCOPIC ([facility] ONLY) - Abnormal; Notable for the following:  Color, Urine YELLOW (*)    APPearance CLOUDY (*)    Hgb urine dipstick 3+ (*)    Protein, ur 30 (*)    Nitrite POSITIVE (*)    Leukocytes, UA 2+ (*)    Bacteria, UA MANY (*)    Squamous Epithelial / LPF TOO NUMEROUS TO COUNT (*)    All other components within normal limits  CBC WITH DIFFERENTIAL/PLATELET - Abnormal; Notable for the following:    RDW 14.7 (*)    All other components within normal limits  BASIC METABOLIC PANEL - Abnormal; Notable for the following:    Anion gap 4 (*)    All other components within normal limits  URINE CULTURE   reviewed the laboratory work shows findings consistent with urinary tract infection. Urine culture is pending   Radiology: CLINICAL DATA: Left-sided flank pain, hematuria  EXAM: RENAL / URINARY TRACT ULTRASOUND COMPLETE  COMPARISON: 08/31/2015  FINDINGS: Right Kidney:  Length: 10.5 cm. Echogenicity within normal limits. No mass or hydronephrosis visualized.  Left Kidney:  Length: 12.1 cm. Echogenicity within normal limits. No mass or hydronephrosis visualized.  Bladder:  Decompressed  IMPRESSION: No acute abnormality noted   Electronically Signed By: Alcide Clever M.D. On: 11/21/2015 14:52    I personally reviewed the radiologic studies    ED Course:  Patient's stay here was uneventful and she states that she has tolerated Rocephin before in the past and was given a gram of IV Rocephin here in emergency department. Patient appears to have adequate pain control with anti-inflammatory medication I was reluctant to prescribe her narcotics at this time due to her persistent nausea, etc. Patient was given a prescription for ibuprofen and Zofran and we switched her antibiotic to Macrodantin. She had acute pyelonephritis though for the time being and I felt there was no need for  hospitalization they'll for symptoms increase or if she develops a fever or persistent vomiting she may require inpatient management. Due to the recurrent nature of her urinary tract infections I referred her to a urologist. I discussed with her urine flow studies and structural evaluation to see why she gets recurrent urinary tract infections.    Assessment: * Acute pyelonephritis     Plan:  Outpatient management Patient was advised to return immediately if condition worsens. Patient was advised to follow up with their primary care physician or other specialized physicians involved in their outpatient care. The patient and/or family member/power of attorney had laboratory results reviewed at the bedside. All questions and concerns were addressed and appropriate discharge instructions were distributed by the nursing staff.            Jennye Moccasin, MD 11/21/15 [phone]

## 2015-11-24 LAB — URINE CULTURE

## 2015-11-25 NOTE — Progress Notes (Signed)
MEDICATION RELATED CONSULT NOTE - INITIAL   Pharmacy Consult for ED CULTURE RESULTS   Allergies  Allergen Reactions  . Penicillins Swelling  . Morphine And Related Rash    Patient Measurements: Height: 5\' 4"  (162.6 cm) Weight: 205 lb (92.987 kg) IBW/kg (Calculated) : 54.7   Microbiology: Recent Results (from the past 720 hour(s))  Urine culture     Status: None   Collection Time: 11/21/15 10:53 AM  Result Value Ref Range Status   Specimen Description URINE, CLEAN CATCH  Final   Special Requests NONE  Final   Culture >=100,000 COLONIES/mL ESCHERICHIA COLI  Final   Report Status 11/24/2015 FINAL  Final   Organism ID, Bacteria ESCHERICHIA COLI  Final      Susceptibility   Escherichia coli - MIC*    AMPICILLIN >=32 RESISTANT Resistant     CEFAZOLIN <=4 SENSITIVE Sensitive     CEFTRIAXONE <=1 SENSITIVE Sensitive     CIPROFLOXACIN <=0.25 SENSITIVE Sensitive     GENTAMICIN <=1 SENSITIVE Sensitive     IMIPENEM <=0.25 SENSITIVE Sensitive     NITROFURANTOIN 64 INTERMEDIATE Intermediate     TRIMETH/SULFA >=320 RESISTANT Resistant     AMPICILLIN/SULBACTAM >=32 RESISTANT Resistant     PIP/TAZO <=4 SENSITIVE Sensitive     Extended ESBL NEGATIVE Sensitive     * >=100,000 COLONIES/mL ESCHERICHIA COLI   Assessment: 25 yo patient who presented to the ED on 11/21/15 and was discharged with diagnosis of acute pyelonephritis. Patient was discharged on nitrofurantoin. Culture results showing intermediate susceptibility.     Plan:  After discussion with Dr. Sharman CheekPhillip Stafford- it was was decided to change therapy to ciprofloxacin 500mg  BID x 7 days.  Patient was contacted at 1730 on 3/24 and culture results and prescription were discussed. Patient verbalized understanding.  Prescription was called into patients pharmacy of choice, [facility] on [address] in [place] at [phone].   Cher NakaiSheema Tyyne Cliett, PharmD Pharmacy Resident  11/25/2015,6:17 PM

## 2015-12-06 ENCOUNTER — Ambulatory Visit: Payer: BLUE CROSS/BLUE SHIELD | Attending: Internal Medicine

## 2017-05-12 IMAGING — CT CT ABD-PELV W/ CM
1 of 2 series · 15 of 32 positions shown, 19 images · IV contrast (omnipaque)
Comparison: None.

CLINICAL DATA: Abdominal cramping, vomiting, diarrhea

EXAM:
CT ABDOMEN AND PELVIS WITH CONTRAST
TECHNIQUE: Multidetector CT imaging of the abdomen and pelvis was performed
using the standard protocol following bolus administration of
intravenous contrast.
CONTRAST:  100mL OMNIPAQUE IOHEXOL 350 MG/ML SOLN

[Series 2: routine abd pel with · axial · 0.68mm/px · z∈[-1071,-611]mm · 15 of 102 slices shown, 19 images]
[im 5/102  soft-tissue]
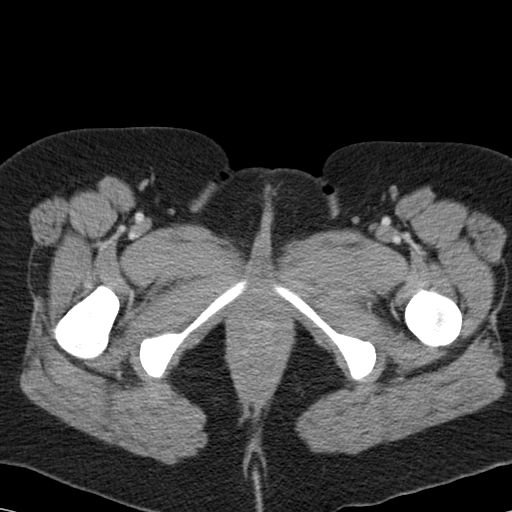
[im 5/102  bone]
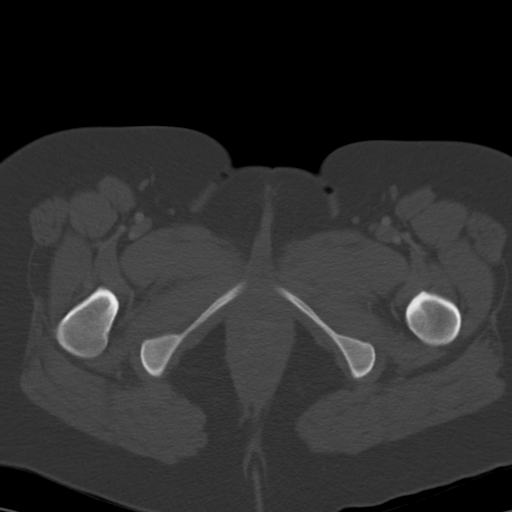
[im 13/102  soft-tissue]
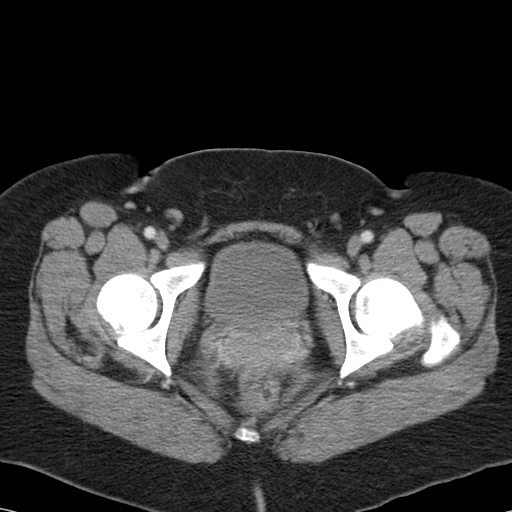
[im 22/102  soft-tissue]
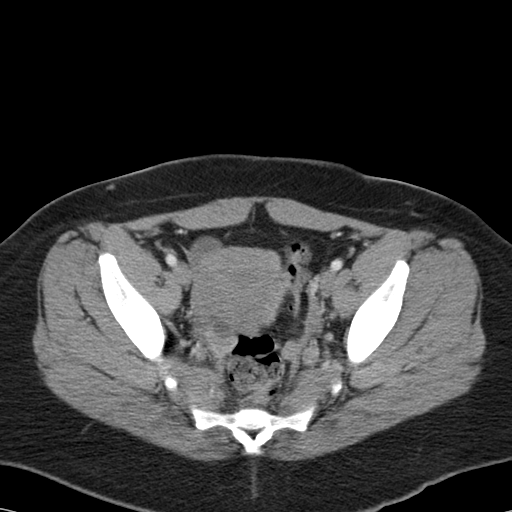
[im 30/102  soft-tissue]
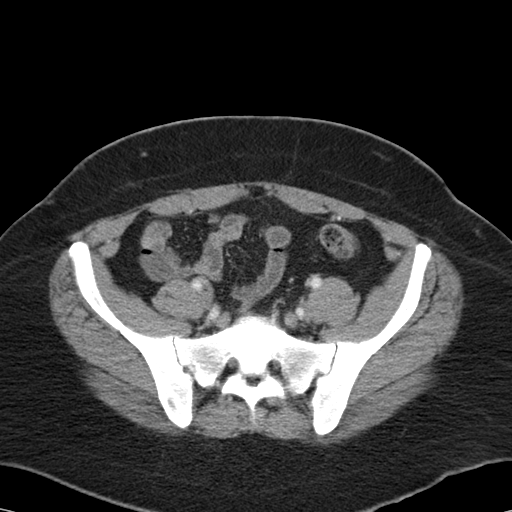
[im 34/102  soft-tissue]
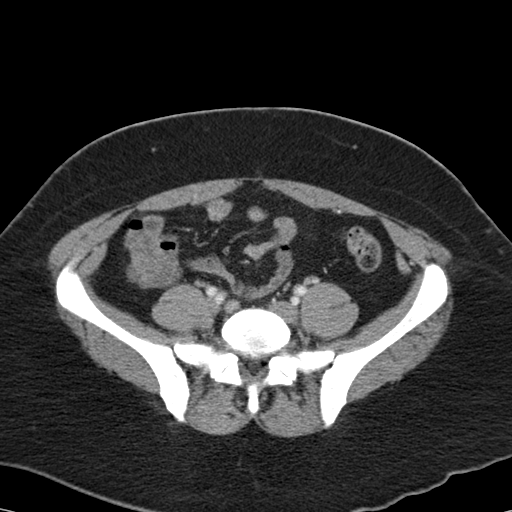
[im 43/102  soft-tissue]
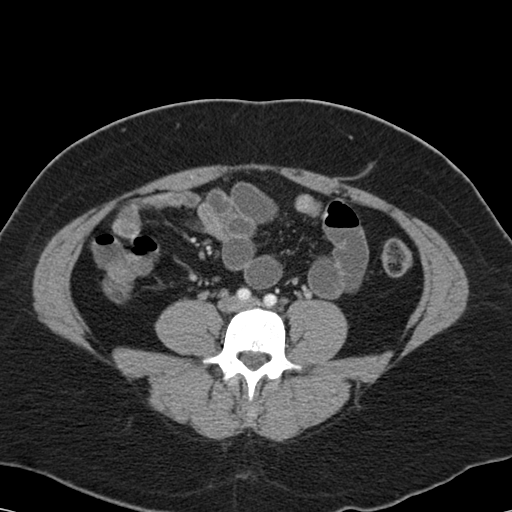
[im 51/102  soft-tissue]
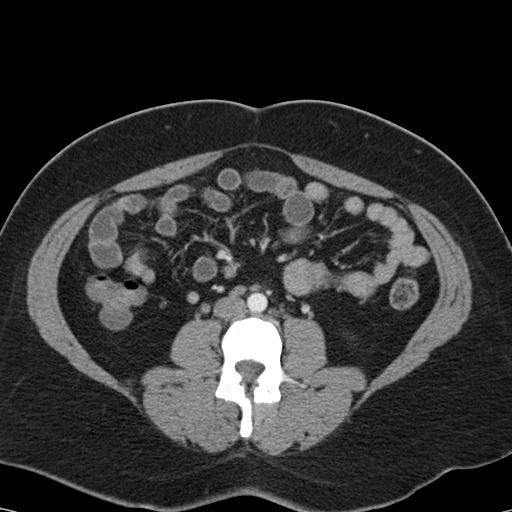
[im 59/102  soft-tissue]
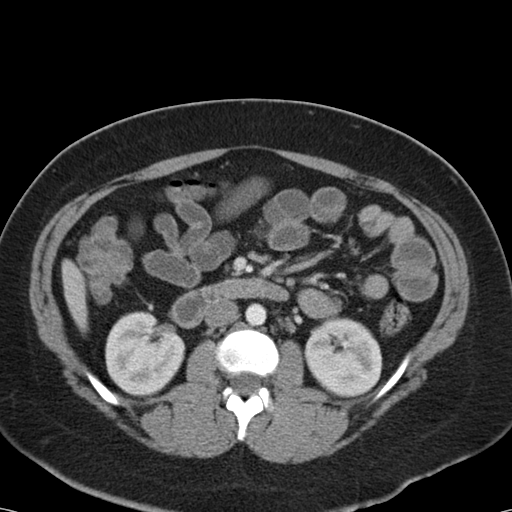
[im 68/102  soft-tissue]
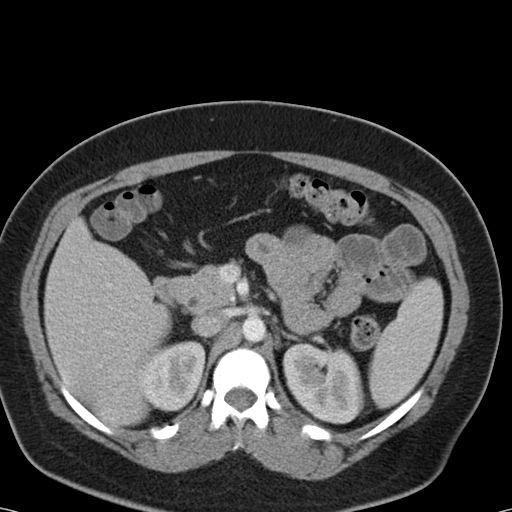
[im 68/102  bone]
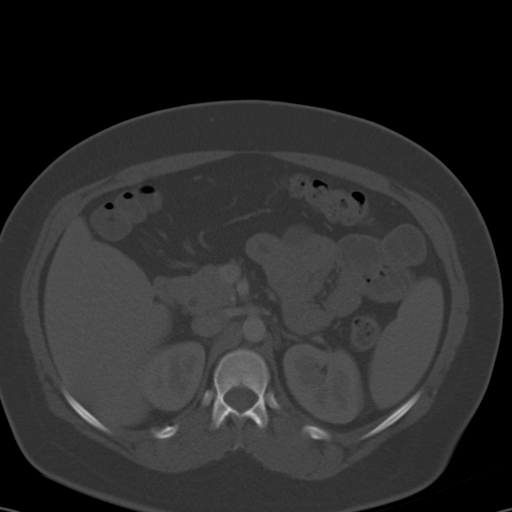
[im 72/102  soft-tissue]
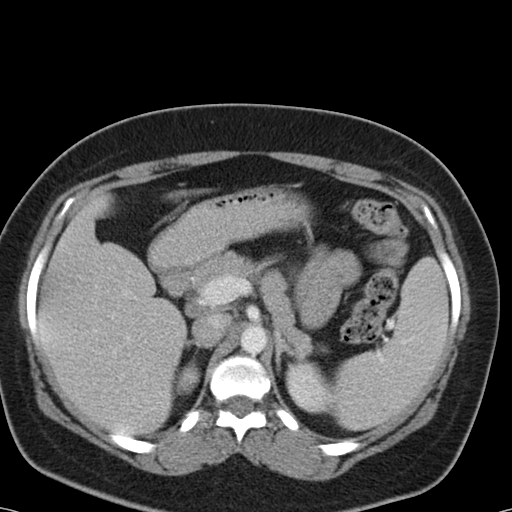
[im 80/102  soft-tissue]
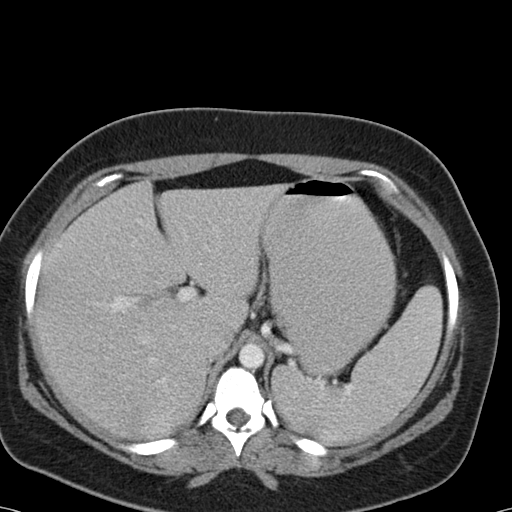
[im 85/102  lung]
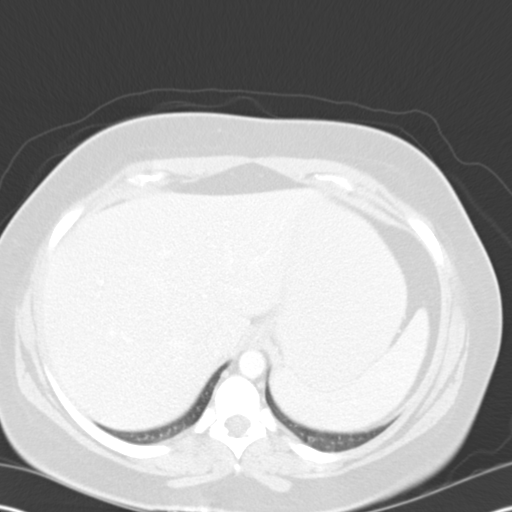
[im 89/102  soft-tissue]
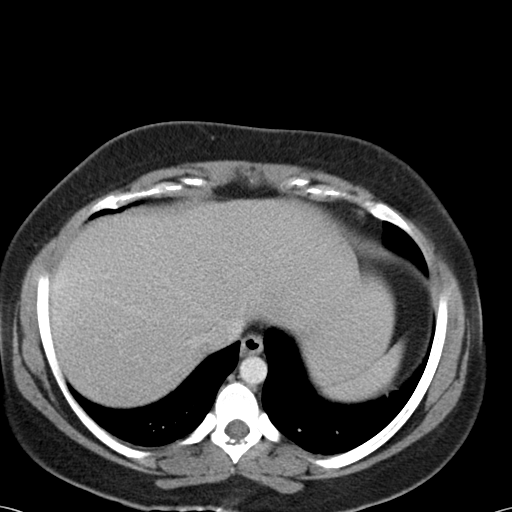
[im 89/102  lung]
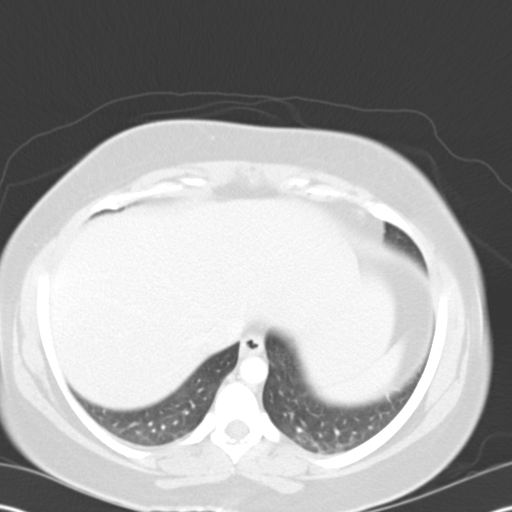
[im 93/102  lung]
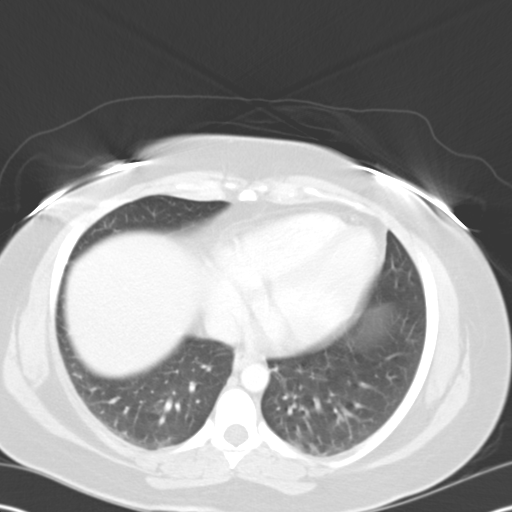
[im 97/102  soft-tissue]
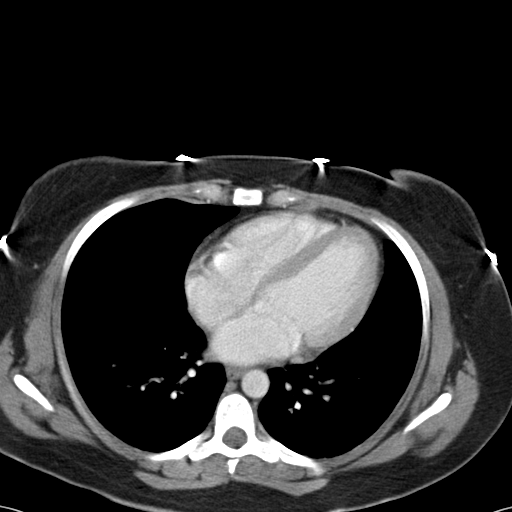
[im 97/102  lung]
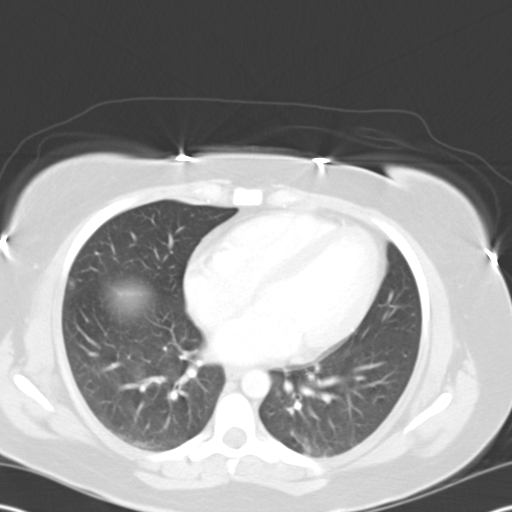

[15 of 32 positions shown; findings below may reference images not displayed]

FINDINGS: Lower chest: Mild dependent atelectasis in the bilateral lower
lobes.

Hepatobiliary: Liver is within normal limits. No
suspicious/enhancing hepatic lesions.

Status post cholecystectomy. No intrahepatic or extrahepatic ductal
dilatation.

Pancreas: Within normal limits.

Spleen: Within normal limits.

Adrenals/Urinary Tract: Adrenal glands within normal limits.

Kidneys within normal limits.  No hydronephrosis.

Bladder is within normal limits.

Stomach/Bowel: Stomach is within normal limits.

No evidence of bowel obstruction.

Suspected appendix is normal (series 2/image 34).

Vascular/Lymphatic: No evidence of abdominal aortic aneurysm.

Small retroperitoneal lymph nodes measuring up to 8 mm short axis,
within normal limits.

Reproductive: Uterus is within normal limits.

Bilateral ovaries are within normal limits.

Other: Trace pelvic ascites.

Musculoskeletal: Visualized osseous structures are within normal
limits.
IMPRESSION: No evidence of bowel obstruction.  Normal appendix.

No CT findings to account the patient's abdominal pain.

## 2018-02-22 IMAGING — US US RENAL
1 series · 14 of 25 positions shown · non-contrast
Comparison: 08/31/2015

CLINICAL DATA: Left-sided flank pain, hematuria

EXAM:
RENAL / URINARY TRACT ULTRASOUND COMPLETE

[Series 1: us renal · 0.23mm/px · 14 of 34 slices shown]
[im 1/34]
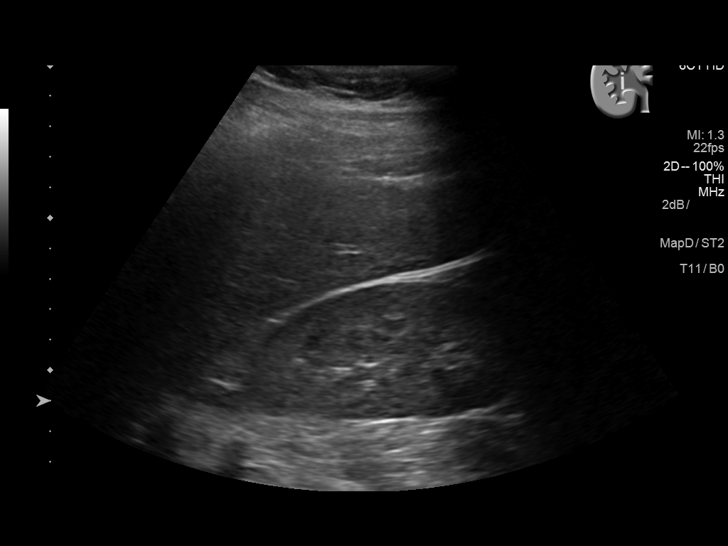
[im 3/34]
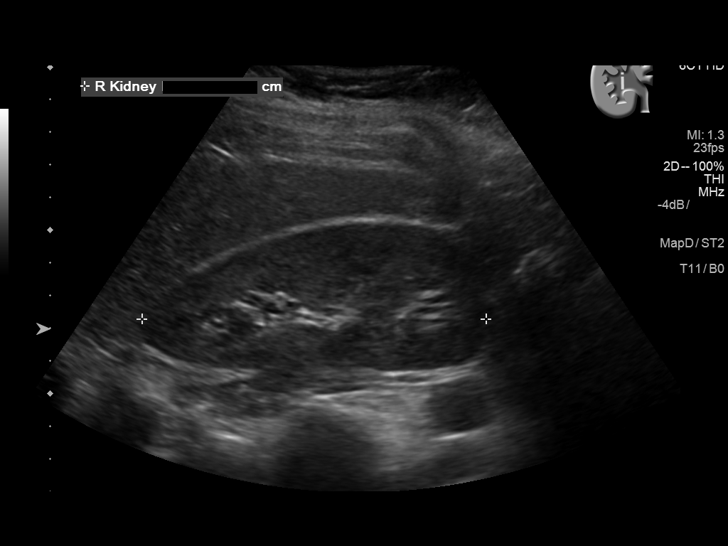
[im 6/34]
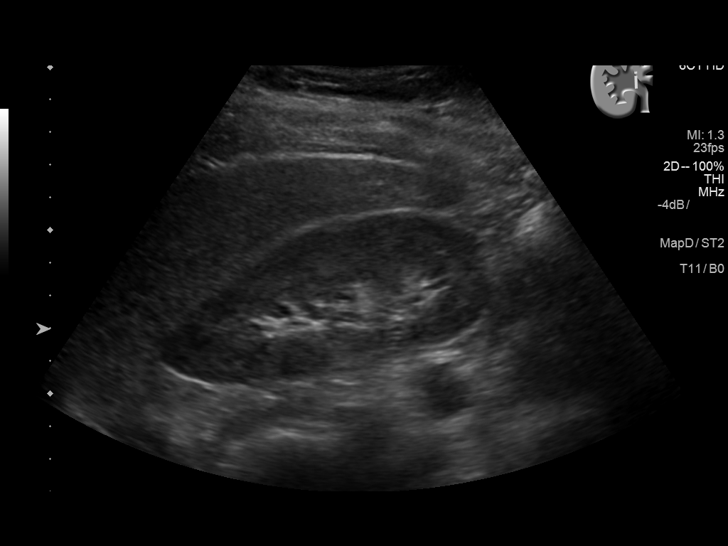
[im 9/34]
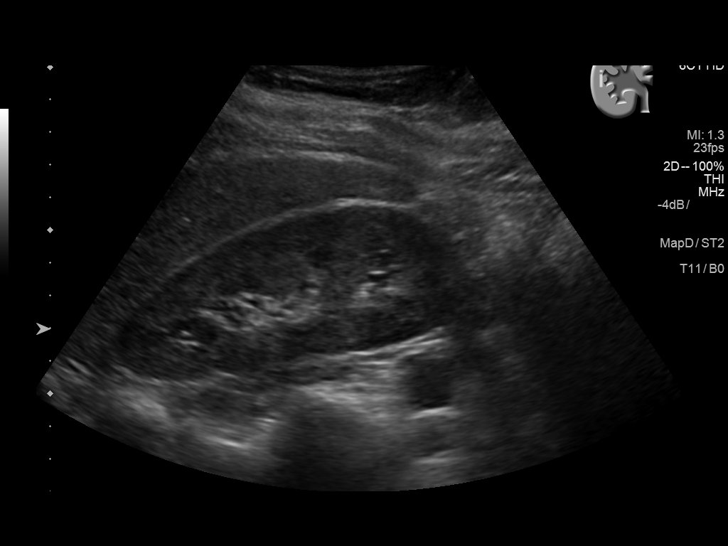
[im 12/34]
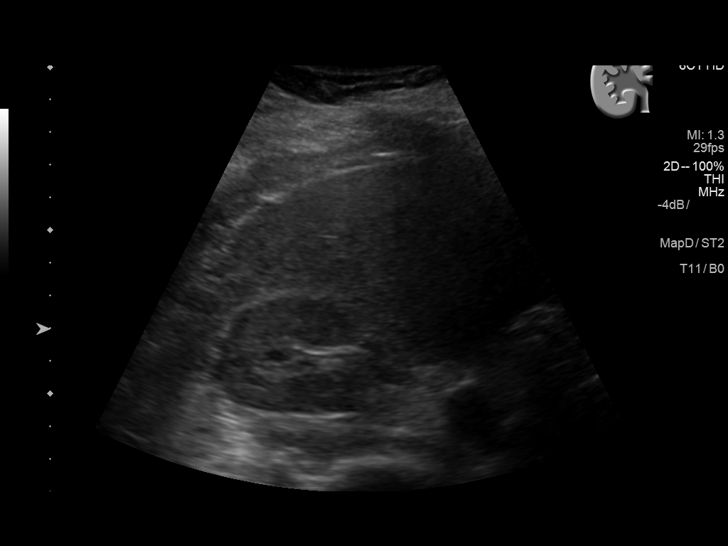
[im 13/34]
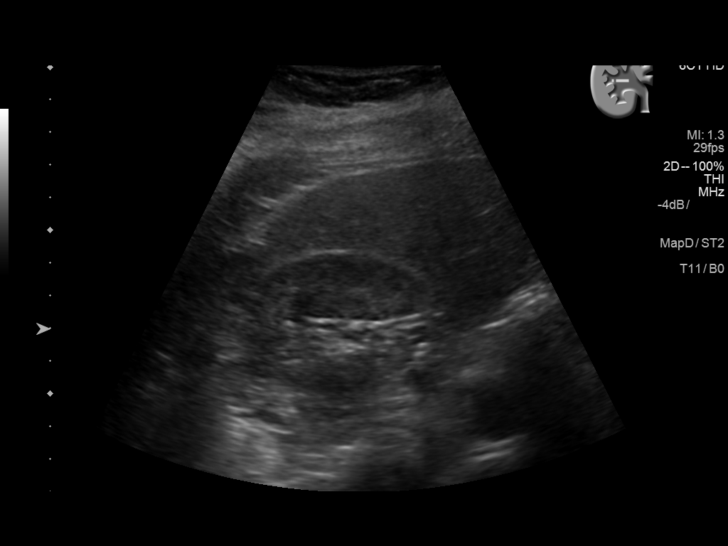
[im 16/34]
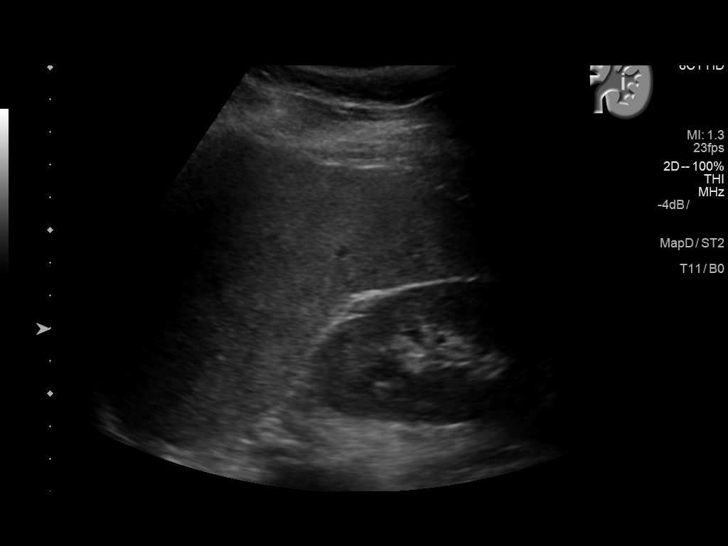
[im 18/34]
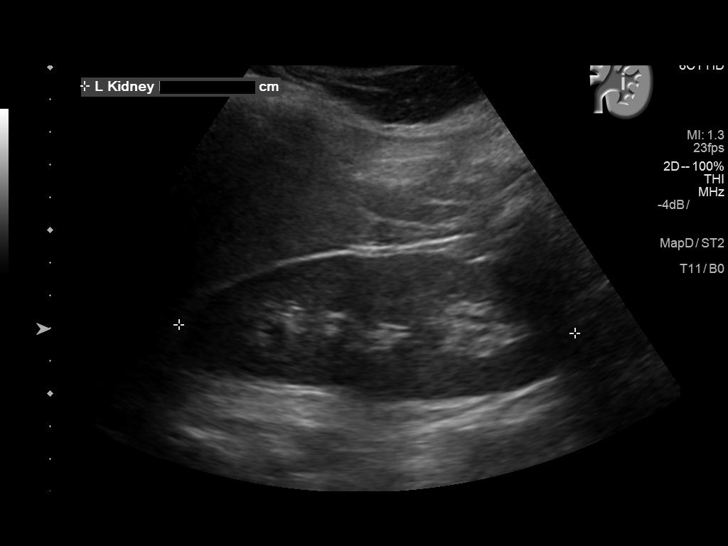
[im 21/34]
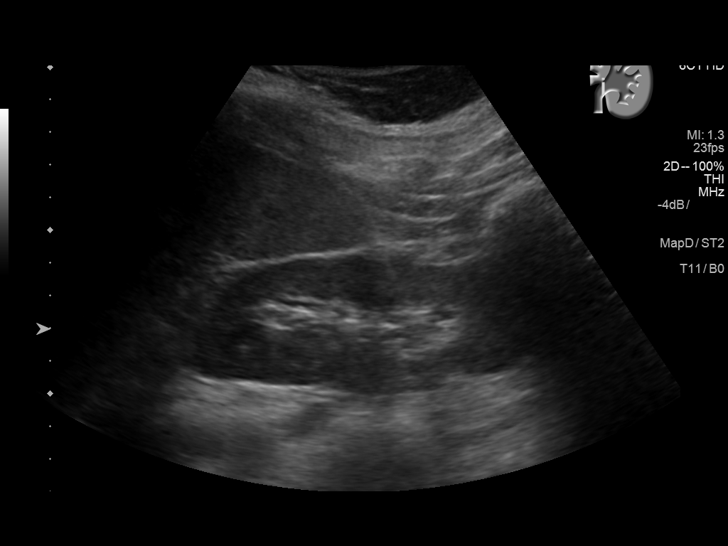
[im 23/34]
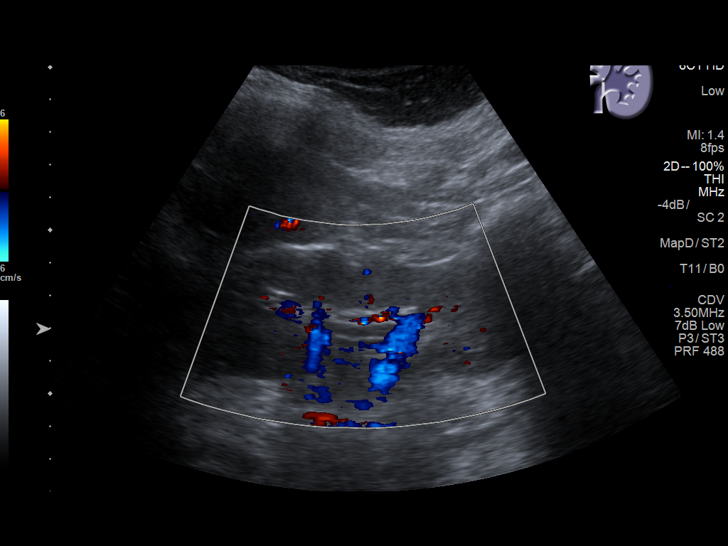
[im 25/34]
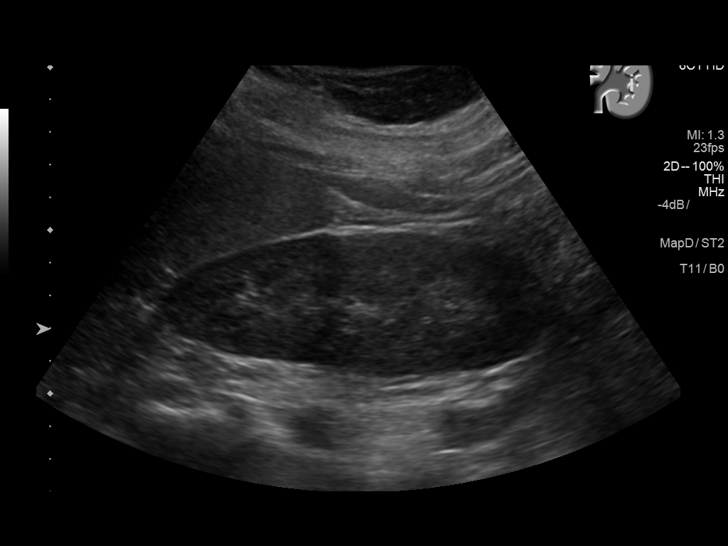
[im 28/34]
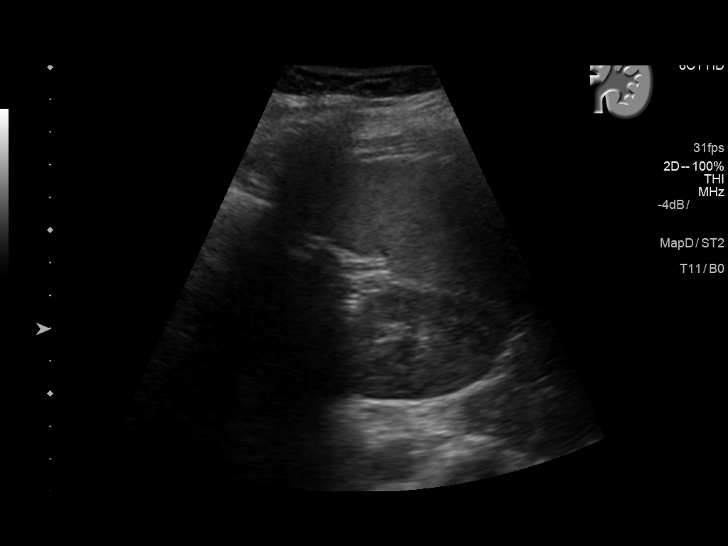
[im 31/34]
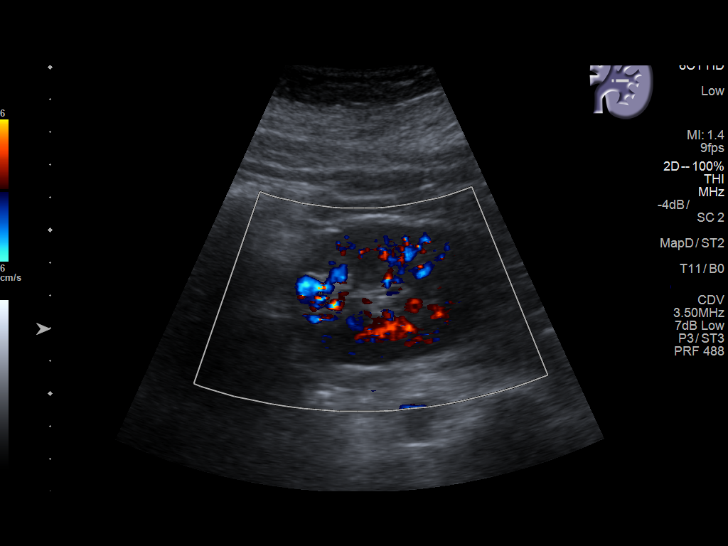
[im 34/34]
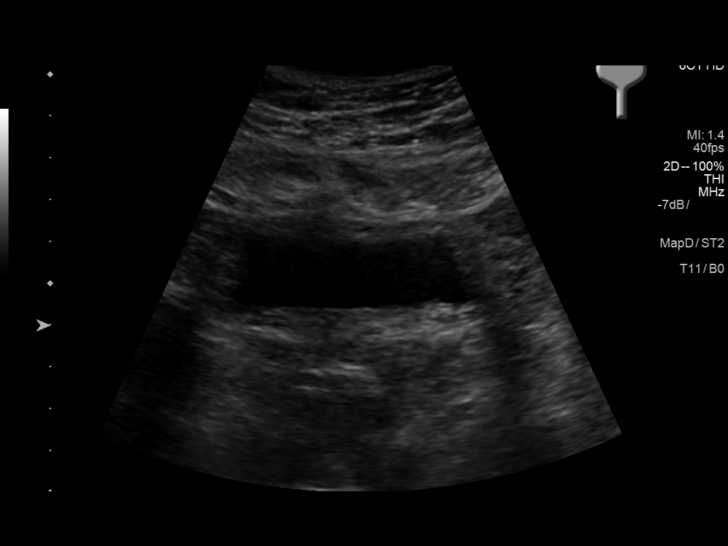

[14 of 25 positions shown; findings below may reference images not displayed]

FINDINGS: Right Kidney:

Length: 10.5 cm.. Echogenicity within normal limits. No mass or
hydronephrosis visualized.

Left Kidney:

Length: 12.1 cm.. Echogenicity within normal limits. No mass or
hydronephrosis visualized.

Bladder:

Decompressed
IMPRESSION: No acute abnormality noted
